# Patient Record
Sex: Male | Born: 1965 | Race: Black or African American | Hispanic: No | Marital: Single | Smoking: Former smoker
Health system: Southern US, Community
[De-identification: ages and names within clinical notes are randomized; demographics above are authoritative.]

## PROBLEM LIST (undated history)

## (undated) DIAGNOSIS — R001 Bradycardia, unspecified: Secondary | ICD-10-CM

## (undated) DIAGNOSIS — N289 Disorder of kidney and ureter, unspecified: Secondary | ICD-10-CM

## (undated) DIAGNOSIS — F32A Depression, unspecified: Secondary | ICD-10-CM

## (undated) DIAGNOSIS — R45851 Suicidal ideations: Secondary | ICD-10-CM

## (undated) DIAGNOSIS — F329 Major depressive disorder, single episode, unspecified: Secondary | ICD-10-CM

## (undated) HISTORY — DX: Depression, unspecified: F32.A

## (undated) HISTORY — PX: LITHOTRIPSY: SUR834

## (undated) HISTORY — PX: KNEE SURGERY: SHX244

## (undated) HISTORY — PX: TONSILLECTOMY: SUR1361

---
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
## 1998-04-15 HISTORY — PX: LITHOTRIPSY: SUR834

## 2001-07-28 ENCOUNTER — Emergency Department (HOSPITAL_COMMUNITY): Admission: EM | Admit: 2001-07-28 | Discharge: 2001-07-28 | Payer: Self-pay | Admitting: Emergency Medicine

## 2001-07-29 ENCOUNTER — Encounter: Payer: Self-pay | Admitting: Emergency Medicine

## 2001-07-29 ENCOUNTER — Emergency Department (HOSPITAL_COMMUNITY): Admission: EM | Admit: 2001-07-29 | Discharge: 2001-07-29 | Payer: Self-pay | Admitting: Emergency Medicine

## 2002-01-24 ENCOUNTER — Emergency Department (HOSPITAL_COMMUNITY): Admission: EM | Admit: 2002-01-24 | Discharge: 2002-01-24 | Payer: Self-pay | Admitting: *Deleted

## 2002-01-24 ENCOUNTER — Encounter: Payer: Self-pay | Admitting: *Deleted

## 2002-02-18 ENCOUNTER — Emergency Department (HOSPITAL_COMMUNITY): Admission: EM | Admit: 2002-02-18 | Discharge: 2002-02-18 | Payer: Self-pay | Admitting: Emergency Medicine

## 2002-03-15 ENCOUNTER — Emergency Department (HOSPITAL_COMMUNITY): Admission: EM | Admit: 2002-03-15 | Discharge: 2002-03-15 | Payer: Self-pay | Admitting: Emergency Medicine

## 2002-11-17 ENCOUNTER — Emergency Department (HOSPITAL_COMMUNITY): Admission: EM | Admit: 2002-11-17 | Discharge: 2002-11-17 | Payer: Self-pay | Admitting: Emergency Medicine

## 2002-12-06 ENCOUNTER — Emergency Department (HOSPITAL_COMMUNITY): Admission: EM | Admit: 2002-12-06 | Discharge: 2002-12-06 | Payer: Self-pay | Admitting: Emergency Medicine

## 2005-02-20 ENCOUNTER — Emergency Department (HOSPITAL_COMMUNITY): Admission: EM | Admit: 2005-02-20 | Discharge: 2005-02-20 | Payer: Self-pay | Admitting: Emergency Medicine

## 2006-09-11 ENCOUNTER — Emergency Department (HOSPITAL_COMMUNITY): Admission: EM | Admit: 2006-09-11 | Discharge: 2006-09-11 | Payer: Self-pay | Admitting: Emergency Medicine

## 2006-09-16 ENCOUNTER — Ambulatory Visit: Payer: Self-pay | Admitting: Cardiology

## 2006-11-07 ENCOUNTER — Emergency Department (HOSPITAL_COMMUNITY): Admission: EM | Admit: 2006-11-07 | Discharge: 2006-11-07 | Payer: Self-pay | Admitting: Emergency Medicine

## 2007-06-03 ENCOUNTER — Emergency Department (HOSPITAL_COMMUNITY): Admission: EM | Admit: 2007-06-03 | Discharge: 2007-06-03 | Payer: Self-pay | Admitting: Emergency Medicine

## 2010-01-03 ENCOUNTER — Emergency Department (HOSPITAL_COMMUNITY): Admission: EM | Admit: 2010-01-03 | Discharge: 2010-01-03 | Payer: Self-pay | Admitting: Emergency Medicine

## 2010-12-13 ENCOUNTER — Encounter: Payer: Self-pay | Admitting: Emergency Medicine

## 2010-12-13 ENCOUNTER — Emergency Department (HOSPITAL_COMMUNITY)
Admission: EM | Admit: 2010-12-13 | Discharge: 2010-12-13 | Disposition: A | Discharge status: Home / Self Care | Payer: Medicaid Other | Attending: Emergency Medicine | Admitting: Emergency Medicine

## 2010-12-13 ENCOUNTER — Emergency Department (HOSPITAL_COMMUNITY): Payer: Medicaid Other

## 2010-12-13 DIAGNOSIS — R079 Chest pain, unspecified: Secondary | ICD-10-CM | POA: Insufficient documentation

## 2010-12-13 DIAGNOSIS — F17200 Nicotine dependence, unspecified, uncomplicated: Secondary | ICD-10-CM | POA: Insufficient documentation

## 2010-12-13 LAB — BASIC METABOLIC PANEL
Calcium: 9.3 mg/dL (ref 8.4–10.5)
Creatinine, Ser: 1.04 mg/dL (ref 0.50–1.35)
GFR calc Af Amer: >60 mL/min (ref >60)
GFR calc non Af Amer: >60 mL/min (ref >60)
Sodium: 139 mEq/L (ref 135–145)

## 2010-12-13 LAB — CBC
MCH: 28.6 pg (ref 26.0–34.0)
MCHC: 32.8 g/dL (ref 30.0–36.0)
MCV: 87.1 fL (ref 78.0–100.0)
Platelets: 321 10*3/uL (ref 150–400)
RBC: 4.34 MIL/uL (ref 4.22–5.81)
RDW: 14 % (ref 11.5–15.5)

## 2010-12-13 LAB — CARDIAC PANEL(CRET KIN+CKTOT+MB+TROPI)
CK, MB: 2.3 ng/mL (ref 0.3–4.0)
Total CK: 168 U/L (ref 7–232)

## 2010-12-13 MED ORDER — SODIUM CHLORIDE 0.9 % IV SOLN
INTRAVENOUS | Status: DC
  Administered 2010-12-13: 10:00:00 via INTRAVENOUS

## 2010-12-13 MED ORDER — NITROGLYCERIN 0.4 MG SL SUBL
0.4000 mg | SUBLINGUAL_TABLET | SUBLINGUAL | Status: DC | PRN
  Administered 2010-12-13 (×3): 0.4 mg via SUBLINGUAL

## 2010-12-13 MED ORDER — ASPIRIN 81 MG PO CHEW
324.0000 mg | CHEWABLE_TABLET | Freq: Once | ORAL | Status: AC
  Administered 2010-12-13: 324 mg via ORAL
  Filled 2010-12-13: qty 4

## 2010-12-13 MED ORDER — ASPIRIN 325 MG PO TABS
325.0000 mg | ORAL_TABLET | ORAL | Status: DC
  Filled 2010-12-13: qty 4

## 2010-12-13 NOTE — ED Notes (Signed)
Pt c/o cp/sob/left arm numbness/n since 0600 this am

## 2010-12-13 NOTE — ED Notes (Signed)
Med Student to bedside for evaluation of patient.

## 2010-12-13 NOTE — ED Notes (Addendum)
Pt denies need for pain and nausea medication at this time and is eager to be discharged from hospital. Pt states needs to get back to job as he is concerned about loss of employment. Dr Adriana Simas aware.

## 2010-12-13 NOTE — ED Provider Notes (Signed)
History   Chart scribed for Benjamin Hutching, MD by Enos Fling; the patient was seen in room APA18/APA18; this patient's care was started at 10:24 AM.    CSN: [id number] Arrival date & time: 12/13/2010  9:25 AM  Chief Complaint  Patient presents with  . Chest Pain   HPI Benjamin Navarro is a 45 y.o. male who presents to the Emergency Department complaining of chest pain. Pt reports waking with chest pain at 6am in the precordial area radiating to left shoulder, pain lasted approx 15 minutes and then improved. Pain is associated left hand numbness, nausea, and sob. Pain improved in ED with NTG. No diaphoresis or vomiting. Pt states similar episode of pain occurred several years ago but was not evaluated. +h/o bradycardia. Pt is smoker (2ppd)  for 20+ years. No h/o HTN, DM, or high cholesterol. Fh/o HTN but no fh/o MI or CABG. Pt reports surgery 1 month ago to remove kidney stones.  PCP Dr. Mena Pauls  History reviewed. No pertinent past medical history.  Past Surgical History  Procedure Date  . Lithotripsy     Family History  Problem Relation Age of Onset  . Hypertension Mother   . Hypertension Father   . Hypertension Sister   . Hypertension Brother     History  Substance Use Topics  . Smoking status: Current Everyday Smoker -- 2.0 packs/day  . Smokeless tobacco: Not on file  . Alcohol Use: No   Previous Medications   HYDROCODONE-ACETAMINOPHEN (VICODIN) 5-500 MG PER TABLET    Take 1 tablet by mouth every 6 (six) hours as needed. For pain    NAPROXEN SODIUM (ANAPROX) 220 MG TABLET    Take 220 mg by mouth 2 (two) times daily with a meal. For pain    OXYCODONE-ACETAMINOPHEN (PERCOCET) 5-325 MG PER TABLET    Take 1 tablet by mouth every 4 (four) hours as needed. For pain    PENICILLIN V POTASSIUM (VEETID) 500 MG TABLET    Take 500 mg by mouth 4 (four) times daily. Take for 7 days    PHENAZOPYRIDINE (PYRIDIUM) 200 MG TABLET    Take 200 mg by mouth 3 (three) times daily as needed. For bladder  infection      Allergies as of 12/13/2010  . (No Known Allergies)       Review of Systems 10 Systems reviewed and are negative for acute change except as noted in the HPI.  Physical Exam  BP 114/61  Pulse 50  Temp 98.4 F (36.9 C)  Resp 18  Ht 5\' 8"  (1.727 m)  Wt 160 lb (72.576 kg)  BMI 24.33 kg/m2  SpO2 100%  Physical Exam  Nursing note and vitals reviewed. Constitutional: He is oriented to person, place, and time. No distress.       Appearance consistent with age of record  HENT:  Head: Normocephalic and atraumatic.  Right Ear: External ear normal.  Left Ear: External ear normal.  Nose: Nose normal.  Mouth/Throat: Oropharynx is clear and moist.  Eyes: Conjunctivae are normal.  Neck: Neck supple.  Cardiovascular: Regular rhythm.  Exam reveals no gallop and no friction rub.   No murmur heard.      bradycardia  Pulmonary/Chest: Effort normal and breath sounds normal. He has no wheezes. He has no rhonchi. He has no rales. He exhibits no tenderness.  Abdominal: Soft. There is no tenderness.  Musculoskeletal: Normal range of motion.       Normal appearance of extremities  Neurological: He is  alert and oriented to person, place, and time. No sensory deficit.  Skin: No rash noted.       Color normal  Psychiatric: He has a normal mood and affect.    ED Course  Procedures OTHER DATA REVIEWED: Nursing notes and vital signs reviewed. Prior records reviewed.   DIAGNOSTIC STUDIES: Oxygen Saturation is 97% on room air, normal by my Interpretation.  EKG:   Date: 12/13/2010  Rate:52  Rhythm: sinus bradycardia  QRS Axis: normal  Intervals: normal  ST/T Wave abnormalities: normal  Conduction Disutrbances:none  Narrative Interpretation:   Old EKG Reviewed: none available  c SA Cardiac Monitor: 12/13/2010 10:59 AM Sinus bradycardia, rate 40, no ectopy   LABS / RADIOLOGY: Results for orders placed during the hospital encounter of 12/13/10  CBC      Component  Value Range   WBC 10.0  4.0 - 10.5 (K/uL)   RBC 4.34  4.22 - 5.81 (MIL/uL)   Hemoglobin 12.4 (*) 13.0 - 17.0 (g/dL)   HCT 96.2 (*) 95.2 - 52.0 (%)   MCV 87.1  78.0 - 100.0 (fL)   MCH 28.6  26.0 - 34.0 (pg)   MCHC 32.8  30.0 - 36.0 (g/dL)   RDW 84.1  32.4 - 40.1 (%)   Platelets 321  150 - 400 (K/uL)  BASIC METABOLIC PANEL      Component Value Range   Sodium 139  135 - 145 (mEq/L)   Potassium 3.7  3.5 - 5.1 (mEq/L)   Chloride 105  96 - 112 (mEq/L)   CO2 27  19 - 32 (mEq/L)   Glucose, Bld 111 (*) 70 - 99 (mg/dL)   BUN 11  6 - 23 (mg/dL)   Creatinine, Ser 0.27  0.50 - 1.35 (mg/dL)   Calcium 9.3  8.4 - 25.3 (mg/dL)   GFR calc non Af Amer >60  >60 (mL/min)   GFR calc Af Amer >60  >60 (mL/min)  CARDIAC PANEL(CRET KIN+CKTOT+MB+TROPI)      Component Value Range   Total CK 168  7 - 232 (U/L)   CK, MB 2.3  0.3 - 4.0 (ng/mL)   Troponin I <0.30  <0.30 (ng/mL)   Relative Index 1.4  0.0 - 2.5    Chest Portable 1 View  12/13/2010  *RADIOLOGY REPORT*  Clinical Data: Chest pain.  Bradycardia.  PORTABLE CHEST - 1 VIEW  Comparison: 06/03/2007  Findings: Pleural - parenchymal scarring at the right lung base is stable.  Lungs otherwise clear.  No evidence of pleural effusion. Heart size within normal limits.  No mass or lymphadenopathy identified.  IMPRESSION: Stable right basilar scarring.  No acute findings.  Original Report Authenticated By: Danae Orleans, M.D.     ED COURSE: I recommend patient be admitted to the hospital. He understands risks of leaving. He wants to go home.  MDM: Pulse and low 40s noted. Otherwise negative workup. Patient claims history of bradycardia.  IMPRESSION: No diagnosis found.   PLAN: All results reviewed and discussed with pt, questions answered, pt agreeable with plan.   CONDITION ON DISCHARGE stable   MEDS GIVEN IN ED:  Medications  nitroGLYCERIN (NITROSTAT) SL tablet 0.4 mg (0.4 mg Sublingual Given 12/13/10 1020)  0.9 %  sodium chloride infusion (   Intravenous New Bag 12/13/10 0945)  penicillin v potassium (VEETID) 500 MG tablet (not administered)  naproxen sodium (ANAPROX) 220 MG tablet (not administered)  HYDROcodone-acetaminophen (VICODIN) 5-500 MG per tablet (not administered)  oxyCODONE-acetaminophen (PERCOCET) 5-325 MG per tablet (not  administered)  phenazopyridine (PYRIDIUM) 200 MG tablet (not administered)  aspirin chewable tablet 324 mg (324 mg Oral Given 12/13/10 0941)     DISCHARGE MEDICATIONS: New Prescriptions   No medications on file     SCRIBE ATTESTATION:   I personally performed the services described in this documentation, which was scribed in my presence. The recorded information has been reviewed and considered. Benjamin Hutching, MD    Benjamin Hutching, MD 12/13/10 [phone]

## 2011-01-28 LAB — POCT CARDIAC MARKERS
CKMB, poc: <1 — ABNORMAL LOW
Myoglobin, poc: 34.2
Operator id: 217071
Troponin i, poc: <0.05

## 2011-01-28 LAB — TROPONIN I: Troponin I: 0.02

## 2011-01-28 LAB — DIFFERENTIAL
Basophils Relative: 1
Eosinophils Absolute: 0.4
Lymphs Abs: 1.9
Neutro Abs: 3.6
Neutrophils Relative %: 54

## 2011-01-28 LAB — CBC
MCV: 89.4
Platelets: 217
WBC: 6.7

## 2011-01-28 LAB — BASIC METABOLIC PANEL
BUN: 11
Calcium: 8.8
Chloride: 106
Creatinine, Ser: 0.91
GFR calc Af Amer: >60

## 2011-01-28 LAB — CK TOTAL AND CKMB (NOT AT ARMC): Total CK: 141

## 2017-11-29 ENCOUNTER — Emergency Department (HOSPITAL_COMMUNITY)

## 2017-11-29 ENCOUNTER — Encounter (HOSPITAL_COMMUNITY): Payer: Self-pay | Admitting: Emergency Medicine

## 2017-11-29 ENCOUNTER — Emergency Department (HOSPITAL_COMMUNITY)
Admission: EM | Admit: 2017-11-29 | Discharge: 2017-11-29 | Disposition: A | Discharge status: Home / Self Care | Attending: Emergency Medicine | Admitting: Emergency Medicine

## 2017-11-29 DIAGNOSIS — Z79899 Other long term (current) drug therapy: Secondary | ICD-10-CM | POA: Diagnosis not present

## 2017-11-29 DIAGNOSIS — R404 Transient alteration of awareness: Secondary | ICD-10-CM | POA: Insufficient documentation

## 2017-11-29 DIAGNOSIS — F17210 Nicotine dependence, cigarettes, uncomplicated: Secondary | ICD-10-CM | POA: Insufficient documentation

## 2017-11-29 DIAGNOSIS — R4182 Altered mental status, unspecified: Secondary | ICD-10-CM | POA: Diagnosis present

## 2017-11-29 HISTORY — DX: Disorder of kidney and ureter, unspecified: N28.9

## 2017-11-29 LAB — URINALYSIS, ROUTINE W REFLEX MICROSCOPIC
BILIRUBIN URINE: NEGATIVE
Bacteria, UA: NONE SEEN
GLUCOSE, UA: NEGATIVE mg/dL
HGB URINE DIPSTICK: NEGATIVE
KETONES UR: NEGATIVE mg/dL
NITRITE: NEGATIVE
PH: 7 (ref 5.0–8.0)
PROTEIN: NEGATIVE mg/dL
Specific Gravity, Urine: 1.019 (ref 1.005–1.030)

## 2017-11-29 LAB — COMPREHENSIVE METABOLIC PANEL
ALBUMIN: 3.4 g/dL — AB (ref 3.5–5.0)
ALK PHOS: 55 U/L (ref 38–126)
ALT: 13 U/L (ref 0–44)
ANION GAP: 7 (ref 5–15)
AST: 16 U/L (ref 15–41)
BILIRUBIN TOTAL: 0.7 mg/dL (ref 0.3–1.2)
BUN: 12 mg/dL (ref 6–20)
CALCIUM: 8.5 mg/dL — AB (ref 8.9–10.3)
CO2: 27 mmol/L (ref 22–32)
Chloride: 106 mmol/L (ref 98–111)
Creatinine, Ser: 1.14 mg/dL (ref 0.61–1.24)
GFR calc Af Amer: >60 mL/min (ref >60)
GFR calc non Af Amer: >60 mL/min (ref >60)
GLUCOSE: 96 mg/dL (ref 70–99)
Potassium: 4.2 mmol/L (ref 3.5–5.1)
SODIUM: 140 mmol/L (ref 135–145)
TOTAL PROTEIN: 6.6 g/dL (ref 6.5–8.1)

## 2017-11-29 LAB — CBC WITH DIFFERENTIAL/PLATELET
BASOS ABS: 0.1 10*3/uL (ref 0.0–0.1)
BASOS PCT: 1 %
EOS PCT: 3 %
Eosinophils Absolute: 0.2 10*3/uL (ref 0.0–0.7)
HEMATOCRIT: 43.2 % (ref 39.0–52.0)
Hemoglobin: 13.4 g/dL (ref 13.0–17.0)
Lymphocytes Relative: 26 %
Lymphs Abs: 1.9 10*3/uL (ref 0.7–4.0)
MCH: 29 pg (ref 26.0–34.0)
MCHC: 31 g/dL (ref 30.0–36.0)
MCV: 93.5 fL (ref 78.0–100.0)
MONO ABS: 0.6 10*3/uL (ref 0.1–1.0)
MONOS PCT: 9 %
NEUTROS ABS: 4.4 10*3/uL (ref 1.7–7.7)
Neutrophils Relative %: 61 %
PLATELETS: 268 10*3/uL (ref 150–400)
RBC: 4.62 MIL/uL (ref 4.22–5.81)
RDW: 14 % (ref 11.5–15.5)
WBC: 7.2 10*3/uL (ref 4.0–10.5)

## 2017-11-29 LAB — AMMONIA: Ammonia: 13 umol/L (ref 9–35)

## 2017-11-29 LAB — RAPID URINE DRUG SCREEN, HOSP PERFORMED
Amphetamines: NOT DETECTED
BARBITURATES: NOT DETECTED
Benzodiazepines: NOT DETECTED
Cocaine: NOT DETECTED
Opiates: NOT DETECTED
TETRAHYDROCANNABINOL: POSITIVE — AB

## 2017-11-29 LAB — ACETAMINOPHEN LEVEL: Acetaminophen (Tylenol), Serum: <10 ug/mL — ABNORMAL LOW (ref 10–30)

## 2017-11-29 LAB — SALICYLATE LEVEL: Salicylate Lvl: <7 mg/dL (ref 2.8–30.0)

## 2017-11-29 LAB — CBG MONITORING, ED: Glucose-Capillary: 73 mg/dL (ref 70–99)

## 2017-11-29 MED ORDER — SODIUM CHLORIDE 0.9 % IV BOLUS
1000.0000 mL | Freq: Once | INTRAVENOUS | Status: AC
  Administered 2017-11-29: 1000 mL via INTRAVENOUS

## 2017-11-29 MED ORDER — SODIUM CHLORIDE 0.9 % IV SOLN: INTRAVENOUS | Status: DC

## 2017-11-29 NOTE — ED Notes (Signed)
PT ambulated in room at this time. EDP made aware.

## 2017-11-29 NOTE — ED Notes (Signed)
When pt was told we needed to do an in and out catheter for urine he raised his right hand up and was offered a urinal. PT was able to urinate into urinal at this time.

## 2017-11-29 NOTE — ED Provider Notes (Signed)
[facility] EMERGENCY DEPARTMENT Provider Note   CSN: [id number] Arrival date & time: 11/29/17  1117     History   Chief Complaint Chief Complaint  Patient presents with  . Altered Mental Status    HPI Valdemar L XxxArtis is a 52 y.o. male.  Pt presents to the ED today with altered mental status.  [facility]  He was put on suicide watch yesterday because he told the jail that he did not know what he was going to do to himself.  He has also been making verbal threats to the staff at the jail.  They have had him on q15 min checks.  When they went to check on him pta, they found him on the ground unresponsive.  The pt would not talk to anyone, including me.  The jail denies finding any drugs in his room or anything that he could hurt himself with.     Past Medical History:  Diagnosis Date  . Renal disorder    kidney stones    There are no active problems to display for this patient.   Past Surgical History:  Procedure Laterality Date  . LITHOTRIPSY          Home Medications    Prior to Admission medications   Medication Sig Start Date End Date Taking? Authorizing Provider  OLANZapine (ZYPREXA) 5 MG tablet Take 10 mg by mouth daily.    [provider]    Family History Family History  Problem Relation Age of Onset  . Hypertension Mother   . Hypertension Father   . Hypertension Sister   . Hypertension Brother     Social History Social History   Tobacco Use  . Smoking status: Current Every Day Smoker    Packs/day: 2.00  . Smokeless tobacco: Never Used  Substance Use Topics  . Alcohol use: No  . Drug use: No     Allergies   Patient has no known allergies.   Review of Systems Review of Systems  Unable to perform ROS: Mental status change     Physical Exam Updated Vital Signs BP 109/62   Pulse (!) 45   Resp 18   SpO2 98%   Physical Exam  Constitutional: He appears well-developed and well-nourished.  HENT:  Head:  Normocephalic and atraumatic.  Right Ear: External ear normal.  Left Ear: External ear normal.  Nose: Nose normal.  Mouth/Throat: Oropharynx is clear and moist.  Eyes: Pupils are equal, round, and reactive to light. Conjunctivae and EOM are normal.  Neck: Normal range of motion. Neck supple.  Cardiovascular: Normal rate, regular rhythm, normal heart sounds and intact distal pulses.  Pulmonary/Chest: Effort normal and breath sounds normal.  Abdominal: Soft. Bowel sounds are normal.  Musculoskeletal: Normal range of motion.  Neurological:  Pt is awake.  He is not following any commands.  He will respond to painful stimuli.  He will not drop his arms over his face.    Skin: Skin is warm. Capillary refill takes less than 2 seconds.  Psychiatric:  Unable to assess  Nursing note and vitals reviewed.    ED Treatments / Results  Labs (all labs ordered are listed, but only abnormal results are displayed) Labs Reviewed  COMPREHENSIVE METABOLIC PANEL - Abnormal; Notable for the following components:      Result Value   Calcium 8.5 (*)    Albumin 3.4 (*)    All other components within normal limits  RAPID URINE DRUG SCREEN,  HOSP PERFORMED - Abnormal; Notable for the following components:   Tetrahydrocannabinol POSITIVE (*)    All other components within normal limits  URINALYSIS, ROUTINE W REFLEX MICROSCOPIC - Abnormal; Notable for the following components:   Leukocytes, UA SMALL (*)    All other components within normal limits  ACETAMINOPHEN LEVEL - Abnormal; Notable for the following components:   Acetaminophen (Tylenol), Serum <10 (*)    All other components within normal limits  CBC WITH DIFFERENTIAL/PLATELET  AMMONIA  SALICYLATE LEVEL  CBG MONITORING, ED    EKG EKG Interpretation  Date/Time:  Saturday November 29 2017 11:20:47 EDT Ventricular Rate:  50 PR Interval:    QRS Duration: 90 QT Interval:  413 QTC Calculation: 377 R Axis:   68 Text Interpretation:  Sinus rhythm  Abnormal R-wave progression, early transition Minimal ST elevation, anterior leads No significant change since last tracing Confirmed by Jacalyn Lefevre [phone]) on 11/29/2017 1:06:27 PM   Radiology Ct Head Wo Contrast  Result Date: 11/29/2017 CLINICAL DATA:  Found unconscious, nonverbal, altered mental status EXAM: CT HEAD WITHOUT CONTRAST CT CERVICAL SPINE WITHOUT CONTRAST TECHNIQUE: Multidetector CT imaging of the head and cervical spine was performed following the standard protocol without intravenous contrast. Multiplanar CT image reconstructions of the cervical spine were also generated. COMPARISON:  07/30/2016 FINDINGS: CT HEAD FINDINGS Brain: No evidence of acute infarction, hemorrhage, hydrocephalus, extra-axial collection or mass lesion/mass effect. Vascular: No hyperdense vessel or unexpected calcification. Skull: Normal. Negative for fracture or focal lesion. Sinuses/Orbits: Minor scattered sinus mucosal thickening diffusely. No sinus air-fluid level or complete opacification. Orbits unremarkable. Other: External auditory canals demonstrate cerumen impaction bilaterally. CT CERVICAL SPINE FINDINGS Alignment: Normal. Skull base and vertebrae: Incomplete fusion of the C1 arch posteriorly, normal variant. No acute osseous finding or fracture. Vertebral body heights maintained. No subluxation or dislocation. Soft tissues and spinal canal: No prevertebral fluid or swelling. No visible canal hematoma. Disc levels: Minor endplate bony spurring at C5-6. Otherwise disc spaces preserved. Upper chest: Apical bullous disease noted, worse on the right. Other: None. IMPRESSION: Normal head CT without contrast. Mild chronic sinus mucosal thickening Cerumen impaction bilaterally Minor C5-6 spondylosis without acute cervical spine fracture or malalignment. Stable exam. Electronically Signed   By: Judie Petit.  Shick M.D.   On: 11/29/2017 15:12   Ct Cervical Spine Wo Contrast  Result Date: 11/29/2017 CLINICAL DATA:  Found  unconscious, nonverbal, altered mental status EXAM: CT HEAD WITHOUT CONTRAST CT CERVICAL SPINE WITHOUT CONTRAST TECHNIQUE: Multidetector CT imaging of the head and cervical spine was performed following the standard protocol without intravenous contrast. Multiplanar CT image reconstructions of the cervical spine were also generated. COMPARISON:  07/30/2016 FINDINGS: CT HEAD FINDINGS Brain: No evidence of acute infarction, hemorrhage, hydrocephalus, extra-axial collection or mass lesion/mass effect. Vascular: No hyperdense vessel or unexpected calcification. Skull: Normal. Negative for fracture or focal lesion. Sinuses/Orbits: Minor scattered sinus mucosal thickening diffusely. No sinus air-fluid level or complete opacification. Orbits unremarkable. Other: External auditory canals demonstrate cerumen impaction bilaterally. CT CERVICAL SPINE FINDINGS Alignment: Normal. Skull base and vertebrae: Incomplete fusion of the C1 arch posteriorly, normal variant. No acute osseous finding or fracture. Vertebral body heights maintained. No subluxation or dislocation. Soft tissues and spinal canal: No prevertebral fluid or swelling. No visible canal hematoma. Disc levels: Minor endplate bony spurring at C5-6. Otherwise disc spaces preserved. Upper chest: Apical bullous disease noted, worse on the right. Other: None. IMPRESSION: Normal head CT without contrast. Mild chronic sinus mucosal thickening Cerumen impaction bilaterally Minor C5-6 spondylosis  without acute cervical spine fracture or malalignment. Stable exam. Electronically Signed   By: Judie PetitM.  Shick M.D.   On: 11/29/2017 15:12   Dg Chest Port 1 View  Result Date: 11/29/2017 CLINICAL DATA:  Altered consciousness.  Suicidal. EXAM: PORTABLE CHEST 1 VIEW COMPARISON:  CT 02/14/2017 FINDINGS: Patient rotated to the left on both radiographs. Normal heart size. No pleural effusion or pneumothorax. Vague increased density throughout the right hemithorax is favored to be  artifactual. No well-defined lobar consolidation. The previously described left-sided pulmonary opacities on CT are not readily apparent. Atherosclerosis in the transverse aorta. IMPRESSION: No definite acute disease. Apparent increased density throughout the right lung is favored to be artifactual. Right base airspace disease cannot be entirely excluded. Aortic Atherosclerosis (ICD10-I70.0). Left-sided pulmonary lesions described on 02/14/2017 CT are not apparent. Please see that report. Electronically Signed   By: Jeronimo GreavesKyle  Talbot M.D.   On: 11/29/2017 13:12    Procedures Procedures (including critical care time)  Medications Ordered in ED Medications  sodium chloride 0.9 % bolus 1,000 mL (1,000 mLs Intravenous New Bag/Given 11/29/17 1223)    And  0.9 %  sodium chloride infusion (has no administration in time range)     Initial Impression / Assessment and Plan / ED Course  I have reviewed the triage vital signs and the nursing notes.  Pertinent labs & imaging results that were available during my care of the patient were reviewed by me and considered in my medical decision making (see chart for details).    When the nurse went to collect urine, she told him she'd have to cath him.  However, he held up a hand for a urinal and was able to urinate into the urinal.  Pt now awake and alert and talking.  He is able to ambulate.  There is medical reason found for his confusion, suspect conversion d/o.    Pt is now stable to go back to jail under suicide watch.  Continue every 15 minute evals while there.  Final Clinical Impressions(s) / ED Diagnoses   Final diagnoses:  Transient alteration of awareness    ED Discharge Orders    None       Jacalyn LefevreHaviland, Harrie Cazarez, MD 11/29/17 1700

## 2017-11-29 NOTE — ED Triage Notes (Signed)
[facility]o unconscious. Staff reports pt is on 15 min checks due to verbal threats to staff for supposedly not administering his home medications correctly. PT arrives to ED non-verbal with eyes closed. PT withdraws from painful stimuli and response from amnia inhalent upon arrival.

## 2018-05-05 ENCOUNTER — Emergency Department (HOSPITAL_COMMUNITY): Payer: Self-pay

## 2018-05-05 ENCOUNTER — Other Ambulatory Visit: Payer: Self-pay

## 2018-05-05 ENCOUNTER — Encounter (HOSPITAL_COMMUNITY): Payer: Self-pay | Admitting: Emergency Medicine

## 2018-05-05 ENCOUNTER — Inpatient Hospital Stay (HOSPITAL_COMMUNITY)
Admission: EM | Admit: 2018-05-05 | Discharge: 2018-05-06 | DRG: 308 | Disposition: A | Discharge status: Home / Self Care | Payer: Self-pay | Attending: Family Medicine | Admitting: Family Medicine

## 2018-05-05 DIAGNOSIS — E559 Vitamin D deficiency, unspecified: Secondary | ICD-10-CM | POA: Diagnosis present

## 2018-05-05 DIAGNOSIS — F329 Major depressive disorder, single episode, unspecified: Secondary | ICD-10-CM | POA: Diagnosis present

## 2018-05-05 DIAGNOSIS — R001 Bradycardia, unspecified: Principal | ICD-10-CM | POA: Diagnosis present

## 2018-05-05 DIAGNOSIS — R55 Syncope and collapse: Secondary | ICD-10-CM | POA: Diagnosis present

## 2018-05-05 DIAGNOSIS — F459 Somatoform disorder, unspecified: Secondary | ICD-10-CM | POA: Diagnosis present

## 2018-05-05 DIAGNOSIS — F17210 Nicotine dependence, cigarettes, uncomplicated: Secondary | ICD-10-CM | POA: Diagnosis present

## 2018-05-05 DIAGNOSIS — Z765 Malingerer [conscious simulation]: Secondary | ICD-10-CM

## 2018-05-05 DIAGNOSIS — Z8249 Family history of ischemic heart disease and other diseases of the circulatory system: Secondary | ICD-10-CM

## 2018-05-05 DIAGNOSIS — Z880 Allergy status to penicillin: Secondary | ICD-10-CM

## 2018-05-05 DIAGNOSIS — F1290 Cannabis use, unspecified, uncomplicated: Secondary | ICD-10-CM | POA: Diagnosis present

## 2018-05-05 DIAGNOSIS — I469 Cardiac arrest, cause unspecified: Secondary | ICD-10-CM | POA: Diagnosis present

## 2018-05-05 DIAGNOSIS — F419 Anxiety disorder, unspecified: Secondary | ICD-10-CM | POA: Diagnosis present

## 2018-05-05 DIAGNOSIS — R45851 Suicidal ideations: Secondary | ICD-10-CM | POA: Diagnosis present

## 2018-05-05 DIAGNOSIS — Z91041 Radiographic dye allergy status: Secondary | ICD-10-CM

## 2018-05-05 DIAGNOSIS — R404 Transient alteration of awareness: Secondary | ICD-10-CM

## 2018-05-05 DIAGNOSIS — R4589 Other symptoms and signs involving emotional state: Secondary | ICD-10-CM | POA: Diagnosis present

## 2018-05-05 LAB — RAPID URINE DRUG SCREEN, HOSP PERFORMED
Amphetamines: NOT DETECTED
BARBITURATES: NOT DETECTED
BENZODIAZEPINES: NOT DETECTED
Cocaine: NOT DETECTED
Opiates: NOT DETECTED
Tetrahydrocannabinol: POSITIVE — AB

## 2018-05-05 LAB — URINALYSIS, ROUTINE W REFLEX MICROSCOPIC
BILIRUBIN URINE: NEGATIVE
GLUCOSE, UA: NEGATIVE mg/dL
HGB URINE DIPSTICK: NEGATIVE
Ketones, ur: NEGATIVE mg/dL
Leukocytes, UA: NEGATIVE
Nitrite: NEGATIVE
PH: 8 (ref 5.0–8.0)
Protein, ur: NEGATIVE mg/dL
SPECIFIC GRAVITY, URINE: 1.008 (ref 1.005–1.030)

## 2018-05-05 LAB — COMPREHENSIVE METABOLIC PANEL
ALBUMIN: 3.8 g/dL (ref 3.5–5.0)
ALT: 12 U/L (ref 0–44)
ANION GAP: 10 (ref 5–15)
AST: 18 U/L (ref 15–41)
Alkaline Phosphatase: 68 U/L (ref 38–126)
BUN: 10 mg/dL (ref 6–20)
CALCIUM: 8.8 mg/dL — AB (ref 8.9–10.3)
CHLORIDE: 106 mmol/L (ref 98–111)
CO2: 24 mmol/L (ref 22–32)
Creatinine, Ser: 0.96 mg/dL (ref 0.61–1.24)
GFR calc non Af Amer: >60 mL/min (ref >60)
GLUCOSE: 90 mg/dL (ref 70–99)
Potassium: 4.3 mmol/L (ref 3.5–5.1)
SODIUM: 140 mmol/L (ref 135–145)
TOTAL PROTEIN: 7.4 g/dL (ref 6.5–8.1)
Total Bilirubin: 0.5 mg/dL (ref 0.3–1.2)

## 2018-05-05 LAB — CBC WITH DIFFERENTIAL/PLATELET
Abs Immature Granulocytes: 0.01 10*3/uL (ref 0.00–0.07)
Basophils Absolute: 0.1 10*3/uL (ref 0.0–0.1)
Basophils Relative: 1 %
EOS ABS: 0.3 10*3/uL (ref 0.0–0.5)
Eosinophils Relative: 5 %
HEMATOCRIT: 50.2 % (ref 39.0–52.0)
Hemoglobin: 15.4 g/dL (ref 13.0–17.0)
Immature Granulocytes: 0 %
LYMPHS ABS: 1.6 10*3/uL (ref 0.7–4.0)
Lymphocytes Relative: 33 %
MCH: 28.7 pg (ref 26.0–34.0)
MCHC: 30.7 g/dL (ref 30.0–36.0)
MCV: 93.7 fL (ref 80.0–100.0)
Monocytes Absolute: 0.4 10*3/uL (ref 0.1–1.0)
Monocytes Relative: 8 %
Neutro Abs: 2.6 10*3/uL (ref 1.7–7.7)
Neutrophils Relative %: 53 %
Platelets: 257 10*3/uL (ref 150–400)
RBC: 5.36 MIL/uL (ref 4.22–5.81)
RDW: 15.3 % (ref 11.5–15.5)
WBC: 4.9 10*3/uL (ref 4.0–10.5)
nRBC: 0 % (ref 0.0–0.2)

## 2018-05-05 LAB — TSH: TSH: 1.017 u[IU]/mL (ref 0.350–4.500)

## 2018-05-05 LAB — LIPASE, BLOOD: Lipase: 21 U/L (ref 11–51)

## 2018-05-05 LAB — VITAMIN B12: Vitamin B-12: 383 pg/mL (ref 180–914)

## 2018-05-05 LAB — CBG MONITORING, ED: Glucose-Capillary: 81 mg/dL (ref 70–99)

## 2018-05-05 LAB — TROPONIN I: Troponin I: <0.03 ng/mL (ref <0.03)

## 2018-05-05 LAB — LACTIC ACID, PLASMA: LACTIC ACID, VENOUS: 1.7 mmol/L (ref 0.5–1.9)

## 2018-05-05 MED ORDER — TRAZODONE HCL 50 MG PO TABS: 25.0000 mg | ORAL_TABLET | Freq: Every evening | ORAL | Status: DC | PRN

## 2018-05-05 MED ORDER — ENOXAPARIN SODIUM 40 MG/0.4ML ~~LOC~~ SOLN: 40.0000 mg | SUBCUTANEOUS | Status: DC

## 2018-05-05 MED ORDER — FLUMAZENIL 0.5 MG/5ML IV SOLN
0.2000 mg | Freq: Once | INTRAVENOUS | Status: AC
  Administered 2018-05-05: 0.2 mg via INTRAVENOUS
  Filled 2018-05-05: qty 5

## 2018-05-05 MED ORDER — POLYETHYLENE GLYCOL 3350 17 G PO PACK: 17.0000 g | PACK | Freq: Every day | ORAL | Status: DC | PRN

## 2018-05-05 MED ORDER — NALOXONE HCL 0.4 MG/ML IJ SOLN
0.4000 mg | Freq: Once | INTRAMUSCULAR | Status: AC
  Administered 2018-05-05: 0.4 mg via INTRAVENOUS
  Filled 2018-05-05: qty 1

## 2018-05-05 MED ORDER — ONDANSETRON HCL 4 MG PO TABS: 4.0000 mg | ORAL_TABLET | Freq: Four times a day (QID) | ORAL | Status: DC | PRN

## 2018-05-05 MED ORDER — CITALOPRAM HYDROBROMIDE 20 MG PO TABS
20.0000 mg | ORAL_TABLET | Freq: Every day | ORAL | Status: DC
  Administered 2018-05-05 – 2018-05-06 (×2): 20 mg via ORAL
  Filled 2018-05-05 (×2): qty 1

## 2018-05-05 MED ORDER — NICOTINE 21 MG/24HR TD PT24
21.0000 mg | MEDICATED_PATCH | Freq: Every day | TRANSDERMAL | Status: DC
  Filled 2018-05-05: qty 1

## 2018-05-05 MED ORDER — ACETAMINOPHEN 650 MG RE SUPP: 650.0000 mg | Freq: Four times a day (QID) | RECTAL | Status: DC | PRN

## 2018-05-05 MED ORDER — SODIUM CHLORIDE 0.9 % IV SOLN
INTRAVENOUS | Status: DC
  Administered 2018-05-05 – 2018-05-06 (×2): via INTRAVENOUS

## 2018-05-05 MED ORDER — ACETAMINOPHEN 325 MG PO TABS: 650.0000 mg | ORAL_TABLET | Freq: Four times a day (QID) | ORAL | Status: DC | PRN

## 2018-05-05 MED ORDER — ONDANSETRON HCL 4 MG/2ML IJ SOLN: 4.0000 mg | Freq: Four times a day (QID) | INTRAMUSCULAR | Status: DC | PRN

## 2018-05-05 NOTE — H&P (Signed)
History and Physical  Benjamin Navarro ZOX:[id number] DOB: 03-24-1966 DOA: 05/05/2018  Referring physician: Lincoln MaxinIdol PCP: Patient, No Pcp Per   Chief Complaint: LOC  HPI: Benjamin HawJohnny L Sukup is a 53 y.o. male with history of nephrolithiasis and depression was in route to an appointment with a local court house when he had a syncopal event when getting out of his car and going to the courtroom.   His family says that he has had bradycardia for all of his life.  He had been lethargic since this event and appears very drowsy, however does respond to direct questions.  He had taken a klonipin earlier.  He apparently did NOT try to overdose.  His pills were counted and the numbers appeared appropriate.  He was given atropine by EMS prior to arrival secondary to a pulse rate in the mid 40s.  He was also given 2.5 mg of Narcan and had improvement in his level of alertness briefly.  He told family members 2 days ago that he was going to jump off of a bridge and [facility] where he had been prescribed celexa and klonipin.  He had just taken the first klonipin tablet this morning prior to the syncopal event.  The RN found marijuana in his pill bottle.    ED Course:  Pt was seen in ED and noted to be bradycardic with HR 30-40 range.  His mentation has improved since being monitored in ED.  He was very unclear about his suicidal ideation status and family presented to assist with history and informed up that patient had threatened to jump off of a bridge just 2 days ago prompting [facility].  The patient's EKG confirmed sinus bradycardia.  He is being admitted for further observation and management.    Review of Systems: All systems reviewed and apart from history of presenting illness, are negative.  Past Medical History:  Diagnosis Date  . Renal disorder    kidney stones   Past Surgical History:  Procedure Laterality Date  . LITHOTRIPSY     Social History:  reports  that he has been smoking. He has been smoking about 2.00 packs per day. He has never used smokeless tobacco. He reports that he does not drink alcohol or use drugs.  No Known Allergies  Family History  Problem Relation Age of Onset  . Hypertension Mother   . Hypertension Father   . Hypertension Sister   . Hypertension Brother     Prior to Admission medications   Medication Sig Start Date End Date Taking? Authorizing Provider  citalopram (CELEXA) 20 MG tablet Take 20 mg by mouth daily. 05/04/18   [provider]  clonazePAM (KLONOPIN) 0.5 MG tablet Take 0.5 mg by mouth 2 (two) times daily as needed. for anxiety 05/04/18   [provider]  OLANZapine (ZYPREXA) 5 MG tablet Take 10 mg by mouth daily.    [provider]   Physical Exam: Vitals:   05/05/18 1200 05/05/18 1215 05/05/18 1230 05/05/18 1245  BP: 114/65 106/67 101/63 111/65  Pulse: (!) 51 (!) 40 (!) 42 (!) 39  Resp: 20 (!) 21 20 18   Temp:      TempSrc:      SpO2: 99% 99% 92% 95%    General exam: Moderately built and nourished patient, lying comfortably supine on the gurney in no obvious distress.  Head, eyes and ENT: Nontraumatic and normocephalic. Pupils equally reacting to  light and accommodation. Oral mucosa moist.  Neck: Supple. No JVD, carotid bruit or thyromegaly.  Lymphatics: No lymphadenopathy.  Respiratory system: Clear to auscultation. No increased work of breathing.  Cardiovascular system: S1 and S2 heard, bradycardic. No JVD, murmurs, gallops, clicks or pedal edema.  Gastrointestinal system: Abdomen is nondistended, soft and nontender. Normal bowel sounds heard. No organomegaly or masses appreciated.  Central nervous system: Alert and oriented. No focal neurological deficits.  Extremities: Symmetric 5 x 5 power. Peripheral pulses symmetrically felt.   Skin: No rashes or acute findings.  Musculoskeletal system: Negative exam.  Psychiatry: Pleasant and cooperative.  Labs on  Admission:  Basic Metabolic Panel: Recent Labs  Lab 05/05/18 1008  NA 140  K 4.3  CL 106  CO2 24  GLUCOSE 90  BUN 10  CREATININE 0.96  CALCIUM 8.8*   Liver Function Tests: Recent Labs  Lab 05/05/18 1008  AST 18  ALT 12  ALKPHOS 68  BILITOT 0.5  PROT 7.4  ALBUMIN 3.8   Recent Labs  Lab 05/05/18 1008  LIPASE 21   No results for input(s): AMMONIA in the last 168 hours. CBC: Recent Labs  Lab 05/05/18 1024  WBC 4.9  NEUTROABS 2.6  HGB 15.4  HCT 50.2  MCV 93.7  PLT 257   Cardiac Enzymes: Recent Labs  Lab 05/05/18 1008  TROPONINI <0.03    BNP (last 3 results) No results for input(s): PROBNP in the last 8760 hours. CBG: Recent Labs  Lab 05/05/18 1124  GLUCAP 81    Radiological Exams on Admission: Ct Head Wo Contrast  Result Date: 05/05/2018 CLINICAL DATA:  Recent syncopal episode EXAM: CT HEAD WITHOUT CONTRAST TECHNIQUE: Contiguous axial images were obtained from the base of the skull through the vertex without intravenous contrast. COMPARISON:  11/29/2017 FINDINGS: Brain: No evidence of acute infarction, hemorrhage, hydrocephalus, extra-axial collection or mass lesion/mass effect. Vascular: No hyperdense vessel or unexpected calcification. Skull: Normal. Negative for fracture or focal lesion. Sinuses/Orbits: The orbits are within normal limits. Chronic mucosal thickening in the ethmoid sinuses is noted. Other: None IMPRESSION: Chronic mucosal thickening in the ethmoid sinuses. No acute intracranial abnormality is noted. Electronically Signed   By: Alcide Clever M.D.   On: 05/05/2018 11:55   Dg Chest Portable 1 View  Result Date: 05/05/2018 CLINICAL DATA:  Recent syncopal episode EXAM: PORTABLE CHEST 1 VIEW COMPARISON:  11/29/2017 FINDINGS: Cardiac shadows within normal limits. Aortic calcifications are seen. The lungs are well aerated bilaterally. Minimal atelectatic changes/scarring are seen in the right lung base. No acute bony abnormality is noted.  IMPRESSION: Atelectasis versus scarring in the right base. No other focal abnormality is noted. Electronically Signed   By: Alcide Clever M.D.   On: 05/05/2018 10:33   EKG: Independently reviewed. Sinus bradycardia   Assessment/Plan Active Problems:   Syncope and collapse   Suicidal risk   Symptomatic bradycardia   Cardiopulmonary arrest with successful resuscitation (HCC)  1. Syncope and collapse-patient had a severe syncopal episode earlier today requiring CPR and is now noted to have severe bradycardia however he reports that this is chronic.  He does have history of using marijuana.  I have asked for a urine drug screen to be done.  In addition, check a 2D echocardiogram.  Monitor telemetry.  Check a TSH.  Neurochecks ordered.  Check orthostatic vitals. 2. Symptomatic bradycardia- patient is currently reporting that he has had bradycardia for most of his life and has had no symptoms from it.  We are monitoring  him on telemetry at this time and treating with IV fluid hydration and supportive care.  Consider cardiology evaluation pending work-up. 3. Suicidal risk- patient apparently has been having suicidal ideations I have placed him on suicide precautions and when he is medically stabilized will ask for psychiatry evaluation.  At this facility he will need TTS evaluation. 4. Chronic depression and anxiety-patient was recently started on Celexa 20 mg and Klonopin 0.5 mg.  Hold Klonopin at this time as this has likely precipitated his syncopal episode today.  I have asked for a urine drug screen. 5. Marijuana usage- I have asked for a urine drug screen and will follow-up.  DVT Prophylaxis: Lovenox Code Status: Full Family Communication: Patient Disposition Plan: Pending medical work-up and disposition planning  Time spent: 56 minutes  Standley Dakins, MD [facility]  05/05/2018, 1:23 PM

## 2018-05-05 NOTE — ED Provider Notes (Signed)
[facility] EMERGENCY DEPARTMENT Provider Note   CSN: [id number] Arrival date & time: 05/05/18  4920     History   Chief Complaint Chief Complaint  Patient presents with  . Loss of Consciousness    HPI Benjamin Navarro is a 53 y.o. male with no significant past medical history per chart, was in route to an appointment with a local court house when he had a syncopal event when getting out of his car.  He has had reduced response since this event and appears very drowsy on my initial evaluation, however does respond to direct questions.  He was given atropine by EMS prior to arrival secondary to a pulse rate in the mid 40s.  He was also given 2.5 mg of Narcan and had improvement in his level of alertness briefly.  He endorses to me that he was started on a new medication yesterday after being seen at [facility], it is unclear what this medication was for but states he took the first dose yesterday.  Gives does not give any further history, just has complaint of chest pain.  The history is provided by the patient.    Past Medical History:  Diagnosis Date  . Renal disorder    kidney stones    There are no active problems to display for this patient.   Past Surgical History:  Procedure Laterality Date  . LITHOTRIPSY          Home Medications    Prior to Admission medications   Medication Sig Start Date End Date Taking? Authorizing Provider  citalopram (CELEXA) 20 MG tablet Take 20 mg by mouth daily. 05/04/18   [provider]  clonazePAM (KLONOPIN) 0.5 MG tablet Take 0.5 mg by mouth 2 (two) times daily as needed. for anxiety 05/04/18   [provider]  OLANZapine (ZYPREXA) 5 MG tablet Take 10 mg by mouth daily.    [provider]    Family History Family History  Problem Relation Age of Onset  . Hypertension Mother   . Hypertension Father   . Hypertension Sister   . Hypertension Brother     Social History Social History   Tobacco  Use  . Smoking status: Current Every Day Smoker    Packs/day: 2.00  . Smokeless tobacco: Never Used  Substance Use Topics  . Alcohol use: No  . Drug use: No     Allergies   Patient has no known allergies.   Review of Systems Review of Systems  Unable to perform ROS: Mental status change     Physical Exam Updated Vital Signs BP 136/81   Pulse (!) 48   Temp (!) 97.1 F (36.2 C) (Rectal)   Resp 14   SpO2 98%   Physical Exam Vitals signs and nursing note reviewed.  Constitutional:      Appearance: Normal appearance. He is well-developed.  HENT:     Head: Normocephalic and atraumatic.  Eyes:     Conjunctiva/sclera: Conjunctivae normal.     Pupils: Pupils are equal, round, and reactive to light.     Comments: Sluggish but reactive.  Neck:     Musculoskeletal: Normal range of motion.  Cardiovascular:     Rate and Rhythm: Regular rhythm. Bradycardia present.     Heart sounds: Normal heart sounds.  Pulmonary:     Effort: Pulmonary effort is normal.     Breath sounds: Normal breath sounds. No wheezing.  Chest:     Chest wall: Tenderness present.  Comments: Tender to palpation anterior chest wall.  There is no crepitus or deformity, no visible bruising. Abdominal:     General: Bowel sounds are normal.     Palpations: Abdomen is soft.     Tenderness: There is no abdominal tenderness.  Musculoskeletal: Normal range of motion.  Skin:    General: Skin is warm and dry.  Neurological:     General: No focal deficit present.     Mental Status: He is lethargic.     Comments: Unable to evaluate cranial nerves secondary to lethargy.  He did move all extremities without obvious weakness or neglect.    During initial evaluation, patient requested need to urinate and was cooperative urinating into a urinal.  ED Treatments / Results  Labs (all labs ordered are listed, but only abnormal results are displayed) Labs Reviewed  COMPREHENSIVE METABOLIC PANEL - Abnormal;  Notable for the following components:      Result Value   Calcium 8.8 (*)    All other components within normal limits  URINALYSIS, ROUTINE W REFLEX MICROSCOPIC - Abnormal; Notable for the following components:   Color, Urine STRAW (*)    All other components within normal limits  CULTURE, BLOOD (ROUTINE X 2)  CULTURE, BLOOD (ROUTINE X 2)  LIPASE, BLOOD  TROPONIN I  LACTIC ACID, PLASMA  RAPID URINE DRUG SCREEN, HOSP PERFORMED  CBG MONITORING, ED    EKG EKG Interpretation  Date/Time:  Tuesday May 05 2018 09:51:12 EST Ventricular Rate:  84 PR Interval:    QRS Duration: 88 QT Interval:  358 QTC Calculation: 424 R Axis:   69 Text Interpretation:  Sinus rhythm Ventricular premature complex Aberrant complex Probable left atrial enlargement Confirmed by Vanetta MuldersZackowski, Scott [phone]([id number]) on 05/05/2018 10:28:36 AM   Radiology Ct Head Wo Contrast  Result Date: 05/05/2018 CLINICAL DATA:  Recent syncopal episode EXAM: CT HEAD WITHOUT CONTRAST TECHNIQUE: Contiguous axial images were obtained from the base of the skull through the vertex without intravenous contrast. COMPARISON:  11/29/2017 FINDINGS: Brain: No evidence of acute infarction, hemorrhage, hydrocephalus, extra-axial collection or mass lesion/mass effect. Vascular: No hyperdense vessel or unexpected calcification. Skull: Normal. Negative for fracture or focal lesion. Sinuses/Orbits: The orbits are within normal limits. Chronic mucosal thickening in the ethmoid sinuses is noted. Other: None IMPRESSION: Chronic mucosal thickening in the ethmoid sinuses. No acute intracranial abnormality is noted. Electronically Signed   By: Alcide CleverMark  Lukens M.D.   On: 05/05/2018 11:55   Dg Chest Portable 1 View  Result Date: 05/05/2018 CLINICAL DATA:  Recent syncopal episode EXAM: PORTABLE CHEST 1 VIEW COMPARISON:  11/29/2017 FINDINGS: Cardiac shadows within normal limits. Aortic calcifications are seen. The lungs are well aerated bilaterally. Minimal  atelectatic changes/scarring are seen in the right lung base. No acute bony abnormality is noted. IMPRESSION: Atelectasis versus scarring in the right base. No other focal abnormality is noted. Electronically Signed   By: Alcide CleverMark  Lukens M.D.   On: 05/05/2018 10:33    Procedures Procedures (including critical care time)  Medications Ordered in ED Medications  naloxone [facility](NARCAN) injection 0.4 mg (0.4 mg Intravenous Given 05/05/18 1054)  flumazenil (ROMAZICON) injection 0.2 mg (0.2 mg Intravenous Given 05/05/18 1104)     Initial Impression / Assessment and Plan / ED Course  I have reviewed the triage vital signs and the nursing notes.  Pertinent labs & imaging results that were available during my care of the patient were reviewed by me and considered in my medical decision making (see chart for details).  Patient is very lethargic, although responds appropriately to direct questions and has been cooperative for exam.  10:59 AM  Sister and the friend (who drove him to the courthouse) now present.  Friend states that when she first picked him up this morning he appeared fatigued but was ambulatory, was complaining that his legs felt weak and heavy.  They proceeded to the court house, and as they were walking across the parking lot he became increasingly weak, she had to hold him and he collapsed and became unresponsive.  She states she and bystanders were unable to find a pulse and they started CPR while awaiting EMS arrival.  Sister states that he started 2 medications which she picked from the pharmacy yesterday for him, Celexa and Klonopin which were [facility] Sunday.  She instructed him to start taking these medications this morning.  She states that he has had lots of depression, frequently expresses suicide ideation and has had attempted overdoses in the past.  She cannot say if he possibly overdosed this morning.  There was no seizure-like activity during this morning's  event.  12:02 PM  Patient now completely awake and conversive.  His pulse rate was noted to be in the upper 30s to mid 40s, repeat EKG reflecting sinus bradycardia.  In discussion with the patient, he states he has had a low pulse rate "his whole life", stating sometimes it does get into the 30s and 40s.  He denies ever having any symptoms such as dizziness or syncope with this condition.  He also does endorse a longstanding history of depression over custody issues with his children.  Sister at the bedside endorses that just 2 days ago he threatened to jump off of a bridge.  He currently denies suicidal ideation.    Patient with a history of syncope and altered mental status, transient as he is now awake and alert.  He underwent CPR due to loss of pulse per bystanders prior to arrival.  He remains bradycardic but sinus while here.  Patient will need admission to rule out cardiac source for his symptoms.  He will benefit also from psych eval while here as well.     Final Clinical Impressions(s) / ED Diagnoses   Final diagnoses:  Syncope and collapse  Bradycardia  Transient alteration of awareness    ED Discharge Orders    None       Victoriano Lain 05/05/18 1229    Vanetta Mulders, MD 05/05/18 1420

## 2018-05-05 NOTE — ED Triage Notes (Addendum)
Pt found [place] court house and collapsed to ground.  1mg  atropine, 2.5 mg of narcan given by EMS with improvement.    CBG 119. Pt mildly responsive.

## 2018-05-05 NOTE — ED Notes (Signed)
Pt's HR 39 on the monitor. RN went to assess. Pt has no new complaints. EKG performed and given to Dr. Deretha Emory. Burgess Amor, PA and Dr. Deretha Emory notified.

## 2018-05-06 ENCOUNTER — Inpatient Hospital Stay (HOSPITAL_COMMUNITY): Payer: Self-pay

## 2018-05-06 ENCOUNTER — Inpatient Hospital Stay (HOSPITAL_COMMUNITY)

## 2018-05-06 ENCOUNTER — Encounter (HOSPITAL_COMMUNITY): Payer: Self-pay | Admitting: Behavioral Health

## 2018-05-06 DIAGNOSIS — E559 Vitamin D deficiency, unspecified: Secondary | ICD-10-CM | POA: Diagnosis present

## 2018-05-06 DIAGNOSIS — R55 Syncope and collapse: Secondary | ICD-10-CM

## 2018-05-06 LAB — CBC WITH DIFFERENTIAL/PLATELET
Abs Immature Granulocytes: 0.01 10*3/uL (ref 0.00–0.07)
Basophils Absolute: 0.1 10*3/uL (ref 0.0–0.1)
Basophils Relative: 1 %
Eosinophils Absolute: 0.5 10*3/uL (ref 0.0–0.5)
Eosinophils Relative: 9 %
HCT: 40.1 % (ref 39.0–52.0)
Hemoglobin: 12.3 g/dL — ABNORMAL LOW (ref 13.0–17.0)
Immature Granulocytes: 0 %
Lymphocytes Relative: 33 %
Lymphs Abs: 1.7 10*3/uL (ref 0.7–4.0)
MCH: 28.6 pg (ref 26.0–34.0)
MCHC: 30.7 g/dL (ref 30.0–36.0)
MCV: 93.3 fL (ref 80.0–100.0)
Monocytes Absolute: 0.4 10*3/uL (ref 0.1–1.0)
Monocytes Relative: 8 %
NEUTROS ABS: 2.5 10*3/uL (ref 1.7–7.7)
NEUTROS PCT: 49 %
Platelets: 226 10*3/uL (ref 150–400)
RBC: 4.3 MIL/uL (ref 4.22–5.81)
RDW: 15.1 % (ref 11.5–15.5)
WBC: 5.2 10*3/uL (ref 4.0–10.5)
nRBC: 0 % (ref 0.0–0.2)

## 2018-05-06 LAB — COMPREHENSIVE METABOLIC PANEL
ALT: 11 U/L (ref 0–44)
ANION GAP: 5 (ref 5–15)
AST: 15 U/L (ref 15–41)
Albumin: 3.1 g/dL — ABNORMAL LOW (ref 3.5–5.0)
Alkaline Phosphatase: 47 U/L (ref 38–126)
BUN: 14 mg/dL (ref 6–20)
CHLORIDE: 109 mmol/L (ref 98–111)
CO2: 23 mmol/L (ref 22–32)
Calcium: 8.2 mg/dL — ABNORMAL LOW (ref 8.9–10.3)
Creatinine, Ser: 1.01 mg/dL (ref 0.61–1.24)
GFR calc Af Amer: >60 mL/min (ref >60)
GFR calc non Af Amer: >60 mL/min (ref >60)
Glucose, Bld: 127 mg/dL — ABNORMAL HIGH (ref 70–99)
POTASSIUM: 4.3 mmol/L (ref 3.5–5.1)
Sodium: 137 mmol/L (ref 135–145)
Total Bilirubin: 0.2 mg/dL — ABNORMAL LOW (ref 0.3–1.2)
Total Protein: 6.1 g/dL — ABNORMAL LOW (ref 6.5–8.1)

## 2018-05-06 LAB — MAGNESIUM: Magnesium: 2 mg/dL (ref 1.7–2.4)

## 2018-05-06 LAB — ECHOCARDIOGRAM COMPLETE
Height: 68 in
Weight: 2190.4 oz

## 2018-05-06 LAB — VITAMIN D 25 HYDROXY (VIT D DEFICIENCY, FRACTURES): VIT D 25 HYDROXY: 7 ng/mL — AB (ref 30.0–100.0)

## 2018-05-06 LAB — HIV ANTIBODY (ROUTINE TESTING W REFLEX): HIV Screen 4th Generation wRfx: NONREACTIVE

## 2018-05-06 MED ORDER — VITAMIN D (ERGOCALCIFEROL) 1.25 MG (50000 UNIT) PO CAPS: 50000.0000 [IU] | ORAL_CAPSULE | ORAL | Status: DC

## 2018-05-06 MED ORDER — VITAMIN D (ERGOCALCIFEROL) 1.25 MG (50000 UNIT) PO CAPS: 50000.0000 [IU] | ORAL_CAPSULE | ORAL | 0 refills | Status: AC

## 2018-05-06 NOTE — Progress Notes (Signed)
Patient ambulated approximately 600 ft independently. Resting HR 57. HR during ambulation 97. Pt denies any symptoms.

## 2018-05-06 NOTE — Discharge Instructions (Signed)
Please wear 30 day monitor that will be sent to your home.   Please follow up with cardiology for recheck in 1-2 weeks.  Seek medical care or return to ER if symptoms come back, worsen or new problems develop. Please go [facility] as soon as possible for recheck and to get new medications.  Please stop taking clonazepam.    Ambulatory Cardiac Monitoring An ambulatory cardiac monitor is a small recording device that is used to detect abnormal heart rhythms (arrhythmias). Most monitors are connected by wires to flat, sticky disks (electrodes) that are then attached to your chest. You may need to wear a monitor if you have had symptoms such as:  Fast heartbeats (palpitations).  Dizziness.  Fainting or light-headedness.  Unexplained weakness.  Shortness of breath. There are several types of monitors. Some common monitors include:  Holter monitor. This records your heart rhythm continuously, usually for 24-48 hours.  Event (episodic) monitor. This monitor has a symptoms button, and when pushed, it will begin recording. You need to activate this monitor to record when you have a heart-related symptom.  Automatic detection monitor. This monitor will begin recording when it detects an abnormal heartbeat. What are the risks? Generally, these devices are safe to use. However, it is possible that the skin under the electrodes will become irritated. How to prepare for monitoring Your health care provider will prepare your chest for the electrode placement and show you how to use the monitor.  Do not apply lotions to your chest before monitoring.  Follow directions on how to care for the monitor, and how to return the monitor when the testing period is complete. How to use your cardiac monitor  Follow directions about how long to wear the monitor, and if you can take the monitor off in order to shower or bathe. ? Do not let the monitor get wet. ? Do not bathe, swim, or use a hot tub while  wearing the monitor.  Keep your skin clean. Do not put body lotion or moisturizer on your chest.  Change the electrodes as told by your health care provider, or any time they stop sticking to your skin. You may need to use medical tape to keep them on.  Try to put the electrodes in slightly different places on your chest to help prevent skin irritation. Follow directions from your health care provider about where to place the electrodes.  Make sure the monitor is safely clipped to your clothing or in a location close to your body as recommended by your health care provider.  If your monitor has a symptoms button, press the button to mark an event as soon as you feel a heart-related symptom, such as: ? Dizziness. ? Weakness. ? Light-headedness. ? Palpitations. ? Thumping or pounding in your chest. ? Shortness of breath. ? Unexplained weakness.  Keep a diary of your activities, such as walking, doing chores, and taking medicine. It is very important to note what you were doing when you pushed the button to record your symptoms. This will help your health care provider determine what might be contributing to your symptoms.  Send the recorded information as recommended by your health care provider. It may take some time for your health care provider to process the results.  Change the batteries as told by your health care provider.  Keep electronic devices away from your monitor. These include: ? Tablets. ? MP3 players. ? Cell phones.  While wearing your monitor you should avoid: ?  Electric blankets. ? Firefighter. ? Electric toothbrushes. ? Microwave ovens. ? Magnets. ? Metal detectors. Get help right away if:  You have chest pain.  You have shortness of breath or extreme difficulty breathing.  You develop a very fast heartbeat that does not get better.  You develop dizziness that does not go away.  You faint or constantly feel like you are about to  faint. Summary  An ambulatory cardiac monitor is a small recording device that is used to detect abnormal heart rhythms (arrhythmias).  Make sure you understand how to send the information from the monitor to your health care provider.  It is important to press the button on the monitor when you have any heart-related symptoms.  Keep a diary of your activities, such as walking, doing chores, and taking medicine. It is very important to note what you were doing when you pushed the button to record your symptoms. This will help your health care provider learn what might be causing your symptoms. This information is not intended to replace advice given to you by your health care provider. Make sure you discuss any questions you have with your health care provider. Document Released: 01/09/2008 Document Revised: 01/15/2017 Document Reviewed: 03/16/2016 Elsevier Interactive Patient Education  2019 Elsevier Inc[employer]     Bradycardia, Adult Bradycardia is a slower-than-normal heartbeat. A normal resting heart rate for an adult ranges from 60 to 100 beats per minute. With bradycardia, the resting heart rate is less than 60 beats per minute. Bradycardia can prevent enough oxygen from reaching certain areas of your body when you are active. It can be serious if it keeps enough oxygen from reaching your brain and other parts of your body. Bradycardia is not a problem for everyone. For some healthy adults, a slow resting heart rate is normal. What are the causes? This condition may be caused by:  A problem with the heart, including: ? A problem with the heart's electrical system, such as a heart block. With a heart block, electrical signals between the chambers of the heart are partially or completely blocked, so they are not able to work as they should. ? A problem with the heart's natural pacemaker (sinus node). ? Heart disease. ? A heart attack. ? Heart damage. ? Lyme disease. ? A heart  infection. ? A heart condition that is present at birth (congenital heart defect).  Certain medicines that treat heart conditions.  Certain conditions, such as hypothyroidism and obstructive sleep apnea.  Problems with the balance of chemicals and other substances, like potassium, in the blood.  Trauma.  Radiation therapy. What increases the risk? You are more likely to develop this condition if you:  Are age 16 or older.  Have high blood pressure (hypertension), high cholesterol (hyperlipidemia), or diabetes.  Drink heavily, use tobacco or nicotine products, or use drugs. What are the signs or symptoms? Symptoms of this condition include:  Light-headedness.  Feeling faint or fainting.  Fatigue and weakness.  Trouble with activity or exercise.  Shortness of breath.  Chest pain (angina).  Drowsiness.  Confusion.  Dizziness. How is this diagnosed? This condition may be diagnosed based on:  Your symptoms.  Your medical history.  A physical exam. During the exam, your health care provider will listen to your heartbeat and check your pulse. To confirm the diagnosis, your health care provider may order tests, such as:  Blood tests.  An electrocardiogram (ECG). This test records the heart's electrical activity. The test can show how  fast your heart is beating and whether the heartbeat is steady.  A test in which you wear a portable device (event recorder or Holter monitor) to record your heart's electrical activity while you go about your day.  Anexercise test. How is this treated? Treatment for this condition depends on the cause of the condition and how severe your symptoms are. Treatment may involve:  Treatment of the underlying condition.  Changing your medicines or how much medicine you take.  Having a small, battery-operated device called a pacemaker implanted under the skin. When bradycardia occurs, this device can be used to increase your heart rate  and help your heart beat in a regular rhythm. Follow these instructions at home: Lifestyle   Manage any health conditions that contribute to bradycardia as told by your health care provider.  Follow a heart-healthy diet. A nutrition specialist (dietitian) can help educate you about healthy food options and changes.  Follow an exercise program that is approved by your health care provider.  Maintain a healthy weight.  Try to reduce or manage your stress, such as with yoga or meditation. If you need help reducing stress, ask your health care provider.  Do not use any products that contain nicotine or tobacco, such as cigarettes, e-cigarettes, and chewing tobacco. If you need help quitting, ask your health care provider.  Do not use illegal drugs.  Limit alcohol intake to no more than 1 drink a day for nonpregnant women and 2 drinks a day for men. Be aware of how much alcohol is in your drink. In the [place] one drink equals one 12 oz bottle of beer (355 mL), one 5 oz glass of wine (148 mL), or one 1 oz glass of hard liquor (44 mL). General instructions  Take over-the-counter and prescription medicines only as told by your health care provider.  Keep all follow-up visits as told by your health care provider. This is important. How is this prevented? In some cases, bradycardia may be prevented by:  Treating underlying medical problems.  Stopping behaviors or medicines that can trigger the condition. Contact a health care provider if you:  Feel light-headed or dizzy.  Almost faint.  Feel weak or are easily fatigued during physical activity.  Experience confusion or have memory problems. Get help right away if:  You faint.  You have: ? An irregular heartbeat (palpitations). ? Chest pain. ? Trouble breathing. Summary  Bradycardia is a slower-than-normal heartbeat. With bradycardia, the resting heart rate is less than 60 beats per minute.  Treatment for this condition  depends on the cause.  Manage any health conditions that contribute to bradycardia as told by your health care provider.  Do not use any products that contain nicotine or tobacco, such as cigarettes, e-cigarettes, and chewing tobacco, and limit alcohol intake.  Keep all follow-up visits as told by your health care provider. This is important. This information is not intended to replace advice given to you by your health care provider. Make sure you discuss any questions you have with your health care provider. Document Released: 12/22/2001 Document Revised: 09/10/2017 Document Reviewed: 09/10/2017 Elsevier Interactive Patient Education  2019 [employer].

## 2018-05-06 NOTE — Care Management Note (Signed)
Case Management Note  Patient Details  Name: Benjamin Navarro MRN: [id number] Date of Birth: [birth year]  Subjective/Objective:   Admitted with bradycardia. Pt is from home, uninsured, has no PCP. Employed FT. Pt recently went to [facility] and plans to cont to follow up there. Pt will need follow up for bradycardia.                  Action/Plan: FC will see pt for screening. Pt referred to [facility] to establish follow up care. Pt's only new Rx is for Vit D. - no eligible for MATCH coverage.   Expected Discharge Date:  05/06/18               Expected Discharge Plan:  Home/Self Care  In-House Referral:  Financial Counselor  Discharge planning Services  CM Consult, Indigent Health Clinic  Post Acute Care Choice:  NA Choice offered to:  NA  Status of Service:  Completed, signed off  If discussed at Long Length of Stay Meetings, dates discussed:    Additional Comments:  Malcolm Metro, RN 05/06/2018, 3:43 PM

## 2018-05-06 NOTE — BH Assessment (Signed)
Tele Assessment Note   Patient Name: Benjamin Navarro MRN: [id number] Referring Physician: EDP Location of Patient: [facility] Med Floor Location of Provider: Behavioral Health [name]  Benjamin Navarro is an 53 y.o. male who presented to [facility] for medical issues.  A consult was called because Pt expressed suicidal ideation.  Pt lives alone in [place].  He is employed, and he currently receives outpatient services through [facility] for treatment of depression.  Per report,, Pt expressed suicidal ideation to his cousin over the weekend.  During assessment, Pt acknowledged that he may have made a suicidal statement to cousin, but also stated, ''That was days ago.'' Pt denied current suicidal ideation.  He also denied past suicide attempts.  Pt denied any homicidal ideation, hallucination, or self-injurious behavior.  Pt's sleep and appetite were within normal range.  Pt stated that he is compliant with medication prescribed by [facility].  He denied any current disturbance of mood or thought processes.  During assessment, Pt presented as alert and oriented.  He had good eye contact and was cooperative.  Pt's mood was euthymic, and affect was pleasant and calm.  Pt expressed a desire to be discharged so he could return to work.  He denied suicidal ideation, past suicide attempt, current mood disturbance (although endorsed history of treatment of despondency), hallucination..  Pt's speech was normal in rate, rhythm, and volume.  Thought processes were within normal range, and thought content was logical and goal-oriented.  There was no evidence of delusion.  Pt's memory and concentration intact.  Insight, judgment, and impulse control were good.  Consulted with S. Rankin, NP, who also spoke with Pt.  Pt does not meet inpatient criteria and is psych-cleared.  Diagnosis: Major Depressive Disorder, Mild  Past Medical History:  Past Medical History:  Diagnosis Date  . Renal disorder    kidney stones    Past  Surgical History:  Procedure Laterality Date  . LITHOTRIPSY      Family History:  Family History  Problem Relation Age of Onset  . Hypertension Mother   . Hypertension Father   . Hypertension Sister   . Hypertension Brother     Social History:  reports that he has been smoking. He has been smoking about 2.00 packs per day. He has never used smokeless tobacco. He reports that he does not drink alcohol or use drugs.  Additional Social History:  Alcohol / Drug Use Pain Medications: See MAR Prescriptions: See MAR Over the Counter: See MAR History of alcohol / drug use?: No history of alcohol / drug abuse  CIWA: CIWA-Ar BP: (!) 95/54 Pulse Rate: (!) 41 COWS:    Allergies:  Allergies  Allergen Reactions  . Penicillin G Hives    .Did it involve swelling of the face/tongue/throat, SOB, or low BP? No Did it involve sudden or severe rash/hives, skin peeling, or any reaction on the inside of your mouth or nose? Yes Did you need to seek medical attention at a hospital or doctor's office? Yes When did it last happen?2019 If all above answers are "NO", may proceed with cephalosporin use.   . Iodinated Diagnostic Agents Rash    Home Medications:  Medications Prior to Admission  Medication Sig Dispense Refill  . clonazePAM (KLONOPIN) 0.5 MG tablet Take 0.5 mg by mouth 2 (two) times daily as needed. for anxiety    . citalopram (CELEXA) 20 MG tablet Take 20 mg by mouth daily.    Marland Kitchen OLANZapine (ZYPREXA) 5 MG tablet Take 10  mg by mouth daily.      OB/GYN Status:  No LMP for male patient.  General Assessment Data Location of Assessment: [facility] TTS Assessment: In system Is this a Tele or Face-to-Face Assessment?: Tele Assessment Is this an Initial Assessment or a Re-assessment for this encounter?: Initial Assessment Patient Accompanied by:: N/A Language Other than English: No Living Arrangements: Other (Comment)(Alone) What gender do you identify as?:  Male Marital status: Single Pregnancy Status: No Living Arrangements: Alone Can pt return to current living arrangement?: Yes Admission Status: Voluntary Is patient capable of signing voluntary admission?: Yes Referral Source: Medical Floor Inpatient Insurance type: MCD Potential     Crisis Care Plan Living Arrangements: Alone Name of Psychiatrist: [facility] Name of Therapist: None  Education Status Is patient currently in school?: No Is the patient employed, unemployed or receiving disability?: Employed  Risk to self with the past 6 months Suicidal Ideation: No-Not Currently/Within Last 6 Months Has patient been a risk to self within the past 6 months prior to admission? : Yes Suicidal Intent: No Has patient had any suicidal intent within the past 6 months prior to admission? : No Is patient at risk for suicide?: No Suicidal Plan?: No Has patient had any suicidal plan within the past 6 months prior to admission? : No Access to Means: No Previous Attempts/Gestures: No Intentional Self Injurious Behavior: None Family Suicide History: Unknown Recent stressful life event(s): Recent negative physical changes Persecutory voices/beliefs?: No Depression: Yes Depression Symptoms: Despondent Substance abuse history and/or treatment for substance abuse?: No Suicide prevention information given to non-admitted patients: Not applicable  Risk to Others within the past 6 months Homicidal Ideation: No Does patient have any lifetime risk of violence toward others beyond the six months prior to admission? : No Thoughts of Harm to Others: No Current Homicidal Intent: No Current Homicidal Plan: No Access to Homicidal Means: No History of harm to others?: No Assessment of Violence: None Noted Does patient have access to weapons?: No Criminal Charges Pending?: No Does patient have a court date: No Is patient on probation?: No  Psychosis Hallucinations: None noted Delusions: None  noted  Mental Status Report Appearance/Hygiene: Unremarkable, In hospital gown Eye Contact: Fair Motor Activity: Freedom of movement, Unremarkable Speech: Logical/coherent Level of Consciousness: Alert Mood: Euthymic Affect: Appropriate to circumstance Anxiety Level: None Thought Processes: Coherent, Relevant Judgement: Unimpaired Orientation: Person, Place, Time, Situation Obsessive Compulsive Thoughts/Behaviors: None  Cognitive Functioning Concentration: Normal Memory: Recent Intact, Remote Intact Is patient IDD: No Insight: Good Impulse Control: Good Appetite: Good Have you had any weight changes? : No Change Sleep: No Change Vegetative Symptoms: None  ADLScreening [facility]) Patient's cognitive ability adequate to safely complete daily activities?: Yes Patient able to express need for assistance with ADLs?: Yes Independently performs ADLs?: Yes (appropriate for developmental age)  Prior Inpatient Therapy Prior Inpatient Therapy: Yes Prior Therapy Dates: 1990 approx Reason for Treatment: depression  Prior Outpatient Therapy Prior Outpatient Therapy: Yes Prior Therapy Dates: Ongoing Prior Therapy Facilty/Provider(s): [facility] Reason for Treatment: Depression Does patient have an ACCT team?: No Does patient have Intensive In-House Services?  : No Does patient have [facility] services? : No Does patient have P4CC services?: No  ADL Screening (condition at time of admission) Patient's cognitive ability adequate to safely complete daily activities?: Yes Is the patient deaf or have difficulty hearing?: No Does the patient have difficulty seeing, even when wearing glasses/contacts?: No Does the patient have difficulty concentrating, remembering, or making decisions?:  No Patient able to express need for assistance with ADLs?: Yes Does the patient have difficulty dressing or bathing?: No Independently performs ADLs?: Yes (appropriate for developmental  age) Does the patient have difficulty walking or climbing stairs?: No Weakness of Legs: None Weakness of Arms/Hands: None  Home Assistive Devices/Equipment Home Assistive Devices/Equipment: None  Therapy Consults (therapy consults require a physician order) PT Evaluation Needed: No OT Evalulation Needed: No SLP Evaluation Needed: No Abuse/Neglect Assessment (Assessment to be complete while patient is alone) Abuse/Neglect Assessment Can Be Completed: Yes Physical Abuse: Denies Verbal Abuse: Denies Sexual Abuse: Denies Exploitation of patient/patient's resources: Denies Self-Neglect: Denies Values / Beliefs Cultural Requests During Hospitalization: None Spiritual Requests During Hospitalization: None Consults Spiritual Care Consult Needed: No Social Work Consult Needed: No Merchant navy officerAdvance Directives (For Healthcare) Does Patient Have a Medical Advance Directive?: No Would patient like information on creating a medical advance directive?: No - Patient declined Nutrition Screen- [facility] Adult/[facility] Patient's home diet: Regular Has the patient recently lost weight without trying?: No Has the patient been eating poorly because of a decreased appetite?: No Malnutrition Screening Tool Score: 0        Disposition:  Disposition Initial Assessment Completed for this Encounter: Yes Disposition of Patient: Discharge(Per S. Rankin, NP, Pt does not meet inpt criteria)  This service was provided via telemedicine using a 2-way, interactive audio and video technology.  Names of all persons participating in this telemedicine service and their role in this encounter. Name: Jone BasemanArtis, Inocente Role: Patient             Earline Mayotteugene T Tamarra Geiselman 05/06/2018 1:05 PM

## 2018-05-06 NOTE — Evaluation (Signed)
Physical Therapy Evaluation Patient Details Name: Benjamin Navarro MRN: [id number] DOB: 26-Apr-1965 Today's Date: 05/06/2018   History of Present Illness  Benjamin Navarro is a 53 y.o. male with history of nephrolithiasis and depression was in route to an appointment with a local court house when he had a syncopal event when getting out of his car and going to the courtroom.   His family says that he has had bradycardia for all of his life.  He had been lethargic since this event and appears very drowsy, however does respond to direct questions.  He had taken a klonipin earlier.  He apparently did NOT try to overdose.  His pills were counted and the numbers appeared appropriate.  He was given atropine by EMS prior to arrival secondary to a pulse rate in the mid 40s.  He was also given 2.5 mg of Narcan and had improvement in his level of alertness briefly.  He told family members 2 days ago that he was going to jump off of a bridge and he was [facility] where he had been prescribed celexa and klonipin.  He had just taken the first klonipin tablet this morning prior to the syncopal event.  The RN found marijuana in his pill bottle.    Clinical Impression  Pt admitted with above diagnosis, was received in bed, and was agreeable to PT evaluation. PTA pt independent with all ADLs and IADLs, full community ambulator without assistance. Pt has h/o sinus bradycardia at baseline. Currently, pt able to perform all bed mobility, transfers, and gait at mod I - I level, no evidence of unsteadiness or LOB, and no s/s of dizziness or feelings of syncope. HR once returned to room was 51bpm. Left pt in chair with call bell within reach. Pt educated to call RN or RN Tech if he wanted to walk again to assist with his IV pole and he verbalized understanding. Pt does not need acute or f/u PT services. Will sign off acutely and d/c pt to RN for daily walking (RN made aware).      Follow Up Recommendations No PT  follow up    Equipment Recommendations  None recommended by PT    Recommendations for Other Services       Precautions / Restrictions Precautions Precautions: None Precaution Comments: assistance with IV pole      Mobility  Bed Mobility Overal bed mobility: Independent                Transfers Overall transfer level: Independent                  Ambulation/Gait Ambulation/Gait assistance: Modified independent (Device/Increase time) Gait Distance (Feet): 500 Feet Assistive device: None Gait Pattern/deviations: WFL(Within Functional Limits)     General Gait Details: decreased gait speed for his age, but pt stating this is his regular gait speed; no LOB, steady throughout  Stairs            Wheelchair Mobility    Modified Rankin (Stroke Patients Only)       Balance Overall balance assessment: No apparent balance deficits (not formally assessed)                                           Pertinent Vitals/Pain Pain Assessment: No/denies pain    Home Living Family/patient expects to be discharged to:: Private  residence Living Arrangements: Alone Available Help at Discharge: Family;Available 24 hours/day Type of Home: House       Home Layout: One level Home Equipment: None      Prior Function Level of Independence: Independent         Comments: independent with all ADLs and IADLs     Hand Dominance   Dominant Hand: Right    Extremity/Trunk Assessment   Upper Extremity Assessment Upper Extremity Assessment: Overall WFL for tasks assessed    Lower Extremity Assessment Lower Extremity Assessment: Overall WFL for tasks assessed       Communication   Communication: No difficulties  Cognition Arousal/Alertness: Awake/alert Behavior During Therapy: WFL for tasks assessed/performed Overall Cognitive Status: Within Functional Limits for tasks assessed                                         General Comments      Exercises     Assessment/Plan    PT Assessment Patent does not need any further PT services  PT Problem List         PT Treatment Interventions      PT Goals (Current goals can be found in the Care Plan section)  Acute Rehab PT Goals Patient Stated Goal: to go home PT Goal Formulation: With patient Time For Goal Achievement: 05/13/18 Potential to Achieve Goals: Good    Frequency     Barriers to discharge        Co-evaluation               AM-PAC PT "6 Clicks" Mobility  Outcome Measure Help needed turning from your back to your side while in a flat bed without using bedrails?: None Help needed moving from lying on your back to sitting on the side of a flat bed without using bedrails?: None Help needed moving to and from a bed to a chair (including a wheelchair)?: None Help needed standing up from a chair using your arms (e.g., wheelchair or bedside chair)?: None Help needed to walk in hospital room?: None Help needed climbing 3-5 steps with a railing? : None 6 Click Score: 24    End of Session Equipment Utilized During Treatment: Gait belt Activity Tolerance: Patient tolerated treatment well Patient left: in chair;with call bell/phone within reach Nurse Communication: Mobility status PT Visit Diagnosis: Muscle weakness (generalized) (M62.81)    Time: [id number] PT Time Calculation (min) (ACUTE ONLY): 18 min   Charges:   PT Evaluation $PT Eval Low Complexity: 1 Low           Jac CanavanBrooke  PT, DPT

## 2018-05-06 NOTE — Consult Note (Addendum)
Cardiology Consult    Patient ID: Benjamin Navarro; [id number]; 1965/07/29   Admit date: 05/05/2018 Date of Consult: 05/06/2018  Primary Care Provider: Patient, No Pcp Per Primary Cardiologist: [facility] - Dr. Wyline MoodBranch  Patient Profile    Benjamin Navarro is a 53 y.o. male with past medical history of chronic bradycardia, depression, and tobacco use who is being seen today for the evaluation of syncope and bradycardia at the request of Dr. Laural BenesJohnson.   History of Present Illness    Benjamin Navarro reports being in his usual state of health until yesterday morning when he took Klonopin for the first time and had a syncopal episode later that morning. He had just got out of the car and was walking into the court house and says he developed acute blurred vision and weakness along his legs and passed out. There was concern that he lost a pulse and CPR was started by bystanders. Upon EMS arrival, he was noted to have a pulse and was more responsive. He did receive Narcan x2 and Atropine x1 as his pulse rate was in the 30's.  By review of notes, he had expressed suicidal ideations this past weekend and was evaluated by Benjamin Navarro and prescribed Celexa and Klonopin. Yesterday morning was his first dose of the medications.  In regards to his cardiac history, he reports having bradycardia since he was a teenager and says his heart rate is typically in the 30's to 40's and can increase to the 70's at times. He is active at baseline in working at a [employer] in [place] along [place] and says he walks for a majority of the Benjamin. He denies any recent chest pain or dyspnea on exertion. No prior syncopal episodes. No recent orthopnea, PND, or lower extremity edema. He does report occasional palpitations and says he consumes 9 to 10 cups of coffee per Benjamin.  He is unaware of any family history of CAD or cardiac arrhythmias. No personal history of HTN, HLD, or Type 2 DM. He does smoke 2 packs of  cigarettes per Benjamin and has done so for 30+ years.  Initial labs show WBC 4.9, Hgb 15.4, platelets 257, Na+ 140, K+ 4.3, and creatinine 0.96.  UDS positive for THC.  TSH 1.017. Initial troponin negative. CXR showed atelectasis versus scarring in the right base with no focal abnormalities. CT Head showed no acute intracranial abnormalities. EKG showed normal sinus rhythm, HR 84, with no acute ST changes when compared to prior tracings. Repeat tracing later that Benjamin showed sinus bradycardia, heart rate 43.  He has been followed on telemetry overnight with HR in the mid-30's to 50's. No significant pauses or episodes of CHB.    Past Medical History:  Diagnosis Date  . Renal disorder    kidney stones    Past Surgical History:  Procedure Laterality Date  . LITHOTRIPSY       Home Medications:  Prior to Admission medications   Medication Sig Start Date End Date Taking? Authorizing Provider  clonazePAM (KLONOPIN) 0.5 MG tablet Take 0.5 mg by mouth 2 (two) times daily as needed. for anxiety 05/04/18  Yes [provider]  citalopram (CELEXA) 20 MG tablet Take 20 mg by mouth daily. 05/04/18   [provider]  OLANZapine (ZYPREXA) 5 MG tablet Take 10 mg by mouth daily.    [provider]    Inpatient Medications: Scheduled Meds: . citalopram  20 mg  Oral Daily  . enoxaparin (LOVENOX) injection  40 mg Subcutaneous Q24H  . nicotine  21 mg Transdermal Daily  . [START ON 05/07/2018] Vitamin D (Ergocalciferol)  50,000 Units Oral Once per Benjamin on Mon Thu   Continuous Infusions: . sodium chloride 75 mL/hr at 05/06/18 0447   PRN Meds: acetaminophen **OR** acetaminophen, ondansetron **OR** ondansetron (ZOFRAN) IV, polyethylene glycol, traZODone  Allergies:    Allergies  Allergen Reactions  . Penicillin G Hives    .Did it involve swelling of the face/tongue/throat, SOB, or low BP? No Did it involve sudden or severe rash/hives, skin peeling, or any reaction on the inside of  your mouth or nose? Yes Did you need to seek medical attention at a hospital or doctor's office? Yes When did it last happen?2019 If all above answers are "NO", may proceed with cephalosporin use.   . Iodinated Diagnostic Agents Rash    Social History:   Social History   Socioeconomic History  . Marital status: Single    Spouse name: Not on file  . Number of children: Not on file  . Years of education: Not on file  . Highest education level: Not on file  Occupational History  . Not on file  Social Needs  . Financial resource strain: Not on file  . Food insecurity:    Worry: Not on file    Inability: Not on file  . Transportation needs:    Medical: Not on file    Non-medical: Not on file  Tobacco Use  . Smoking status: Current Every Benjamin Smoker    Packs/Benjamin: 2.00  . Smokeless tobacco: Never Used  Substance and Sexual Activity  . Alcohol use: No  . Drug use: No  . Sexual activity: Not on file  Lifestyle  . Physical activity:    Days per week: Not on file    Minutes per session: Not on file  . Stress: Not on file  Relationships  . Social connections:    Talks on phone: Not on file    Gets together: Not on file    Attends religious service: Not on file    Active member of club or organization: Not on file    Attends meetings of clubs or organizations: Not on file    Relationship status: Not on file  . Intimate partner violence:    Fear of current or ex partner: Not on file    Emotionally abused: Not on file    Physically abused: Not on file    Forced sexual activity: Not on file  Other Topics Concern  . Not on file  Social History Narrative  . Not on file     Family History:    Family History  Problem Relation Age of Onset  . Hypertension Mother   . Hypertension Father   . Hypertension Sister   . Hypertension Brother       Review of Systems    General:  No chills, fever, night sweats or weight changes. Positive for syncope.  Cardiovascular:   No chest pain, dyspnea on exertion, edema, orthopnea, palpitations, paroxysmal nocturnal dyspnea. Dermatological: No rash, lesions/masses Respiratory: No cough, dyspnea Urologic: No hematuria, dysuria Abdominal:   No nausea, vomiting, diarrhea, bright red blood per rectum, melena, or hematemesis Neurologic:  No visual changes, wkns, changes in mental status. All other systems reviewed and are otherwise negative except as noted above.  Physical Exam/Data    Vitals:   05/05/18 1600 05/05/18 1619 05/05/18 1620 05/06/18 0457  BP: (!) 103/49  105/63 (!) 95/54  Pulse: (!) 41  (!) 52 (!) 41  Resp: 14  17 15   Temp:   98.4 F (36.9 C) 98.1 F (36.7 C)  TempSrc:   Oral   SpO2: 100%  99% 100%  Weight:  62.1 kg    Height:  5\' 8"  (1.727 m)      Intake/Output Summary (Last 24 hours) at 05/06/2018 0745 Last data filed at 05/06/2018 0600 Gross per 24 hour  Intake 1000 ml  Output 950 ml  Net 50 ml   Filed Weights   05/05/18 1619  Weight: 62.1 kg   Body mass index is 20.82 kg/m.   General: Pleasant, African American male appearing in NAD Psych: Normal affect. Neuro: Alert and oriented X 3. Moves all extremities spontaneously. HEENT: Normal  Neck: Supple without bruits or JVD. Lungs:  Resp regular and unlabored, CTA without wheezing or rales. Heart: Regular rhythm, bradycardiac rate. No s3, s4, or murmurs. Abdomen: Soft, non-tender, non-distended, BS + x 4.  Extremities: No clubbing, cyanosis or lower extremity edema. DP/PT/Radials 2+ and equal bilaterally.   Labs/Studies     Relevant CV Studies:  Echocardiogram: Pending  Laboratory Data:  Chemistry Recent Labs  Lab 05/05/18 1008  NA 140  K 4.3  CL 106  CO2 24  GLUCOSE 90  BUN 10  CREATININE 0.96  CALCIUM 8.8*  GFRNONAA >60  GFRAA >60  ANIONGAP 10    Recent Labs  Lab 05/05/18 1008  PROT 7.4  ALBUMIN 3.8  AST 18  ALT 12  ALKPHOS 68  BILITOT 0.5   Hematology Recent Labs  Lab 05/05/18 1024  WBC 4.9    RBC 5.36  HGB 15.4  HCT 50.2  MCV 93.7  MCH 28.7  MCHC 30.7  RDW 15.3  PLT 257   Cardiac Enzymes Recent Labs  Lab 05/05/18 1008  TROPONINI <0.03   No results for input(s): TROPIPOC in the last 168 hours.  BNPNo results for input(s): BNP, PROBNP in the last 168 hours.  DDimer No results for input(s): DDIMER in the last 168 hours.  Radiology/Studies:  Ct Head Wo Contrast  Result Date: 05/05/2018 CLINICAL DATA:  Recent syncopal episode EXAM: CT HEAD WITHOUT CONTRAST TECHNIQUE: Contiguous axial images were obtained from the base of the skull through the vertex without intravenous contrast. COMPARISON:  11/29/2017 FINDINGS: Brain: No evidence of acute infarction, hemorrhage, hydrocephalus, extra-axial collection or mass lesion/mass effect. Vascular: No hyperdense vessel or unexpected calcification. Skull: Normal. Negative for fracture or focal lesion. Sinuses/Orbits: The orbits are within normal limits. Chronic mucosal thickening in the ethmoid sinuses is noted. Other: None IMPRESSION: Chronic mucosal thickening in the ethmoid sinuses. No acute intracranial abnormality is noted. Electronically Signed   By: Alcide CleverMark  Lukens M.D.   On: 05/05/2018 11:55   Portable Chest 1 View  Result Date: 05/06/2018 CLINICAL DATA:  Recent CPR EXAM: PORTABLE CHEST 1 VIEW COMPARISON:  05/05/2018 FINDINGS: Cardiac shadow is stable. Mild aortic calcifications are seen. The lungs are well aerated bilaterally. No focal infiltrate or effusion is seen. Stable atelectasis/scarring in the right base is seen. No definitive pneumothorax is noted. No rib abnormality is seen. IMPRESSION: No new focal abnormality noted. Electronically Signed   By: Alcide CleverMark  Lukens M.D.   On: 05/06/2018 07:25   Dg Chest Portable 1 View  Result Date: 05/05/2018 CLINICAL DATA:  Recent syncopal episode EXAM: PORTABLE CHEST 1 VIEW COMPARISON:  11/29/2017 FINDINGS: Cardiac shadows within normal limits. Aortic calcifications are seen. The  lungs are well  aerated bilaterally. Minimal atelectatic changes/scarring are seen in the right lung base. No acute bony abnormality is noted. IMPRESSION: Atelectasis versus scarring in the right base. No other focal abnormality is noted. Electronically Signed   By: Alcide Clever M.D.   On: 05/05/2018 10:33     Assessment & Plan    1. Syncope/ Chronic Bradycardia - he has a history of chronic bradycardia per his report and EKG's dating back to 2012 show HR in the 50's. He had a syncopal episode yesterday and denies any associated chest pain, dyspnea, or palpitations at that time but says he had felt dizzy and nauseated since taking Klonopin earlier that morning. Notes mention CPR was started by bystanders due to concern for him losing a pulse but this was present and he was response by EMS arrival.  - Labs show WBC 4.9, Hgb 15.4, platelets 257, Na+ 140, K+ 4.3, and creatinine 0.96. UDS positive for THC. TSH 1.017. Initial troponin negative. CT Head showed no acute intracranial abnormalities.  - he has been followed on telemetry overnight with HR in the mid-30's to 50's. No significant pauses or episodes of CHB noted. Echocardiogram is pending and scheduled to be performed later today. I have asked the patient to ambulate down the hallway later today so we can assess his HR with activity. If no significant arrhythmias noted on telemetry this admission, would consider a 30-Benjamin cardiac event monitor as an outpatient. Pending results, a GXT could be considered to assess for chronotropic incompetence but he has a history of bradycardia for 30+ years by his report and has never experienced any presyncope or syncopal episodes until yesterday.   2. Depression/ Recent Suicidal Ideation - recently started on Celexa and Klonopin. Klonopin currently held in the setting of his recent syncopal episode. Sitter at the bedside during this encounter.   For questions or updates, please contact [facility] Please consult [email or web address]  for contact info under Cardiology/STEMI.  Signed, Ellsworth Lennox, PA-C 05/06/2018, 7:45 AM Pager: [phone]  Attending note Patient seen and discussed with PA Iran Ouch, I agree with her documentation above. 53 yo male history of depression admitted with syncope  Episode occurred on his way to courthouse for a court appearance after getting out of car.   Odd pattern of intermittent lucency vs nonresponsiveness in the ER in setting of normal vital signs, see ER note, questions about possible psychosomatic symptoms.    Orthostatics negative  K 4.3 Cr 0.97 BUN 10 WBC 4.9 Hgb 15.4 Plt 257 Lactic acid 1.7 TSH 1 Trop neg UDS + THC CXR no acute process CT head no acute process Echo pending Carotid US pending EKG sinus rhythm. Other EKG shows sinus brady   Patient reports yesterday morning feeling nauseous. Episodes of diarrhea in the morning, took his newly prescribed celexa and klonopin. Got in car to go to courthouse, in car continued to feel nauseous, weak, legs felt heavy. After arriving to courthouse to get out of car had episode of syncope.   Prior EKGs show sinus brady at 52 as far back as 2012  Somewhat odd presentation of patient with syncopal episode on his way for a court appearance. Waxing and waning mentation reported in the ER in setting of normal vitals would suggest his bradycardia is not the primary issue. ER staff at leasts mention possible secondary gain/malingering based on odd behavior in ER (see there note). Clinic presentation further muddled by recently starting celexa and klonopin.   Sinus brady  to high 30s low 40s on telemetry, unclear if related to episode. There is a 1% incidence of bradycardia with celexa, recently started by patient, would consider stopping. Have patient ambulate and assess chronotropic response. Likely plan for an outpatient monitor and outpatient EP evaluation. No prior episodes of syncope.    Dina Rich MD

## 2018-05-06 NOTE — Discharge Summary (Addendum)
Physician Discharge Summary  WYAT INFINGER WUJ:[id number] DOB: Apr 06, 1966 DOA: 05/05/2018  Admit date: 05/05/2018 Discharge date: 05/06/2018  Admitted From: Home  Disposition: Home   Recommendations for Outpatient Follow-up:  1. Follow up with cardiology in 1-2 weeks for recheck 2. 30 day event monitor ordered.  3. Please follow up with [facility] in 1-2 days for recheck and medication management  Discharge Condition: STABLE   CODE STATUS: FULL    Brief Hospitalization Summary: Please see all hospital notes, images, labs for full details of the hospitalization. HPI: Benjamin Navarro is a 53 y.o. male with history of nephrolithiasis and depression was in route to an appointment with a local court house when he had a syncopal event when getting out of his car and going to the courtroom.  His family says that he has had bradycardia for all of his life.  He had been lethargic since this event and appears very drowsy, however does respond to direct questions. He had taken a klonipin earlier.  He apparently did NOT try to overdose.  His pills were counted and the numbers appeared appropriate.  He was given atropine by EMS prior to arrival secondary to a pulse rate in the mid 40s. He was also given 2.5 mg of Narcan and had improvement in his level of alertness briefly. He told family members 2 days ago that he was going to jump off of a bridge and he was taken to [facility] on 1/19 where he had been prescribed celexa and klonipin.  He had just taken the first klonipin tablet this morning prior to the syncopal event.  The RN found marijuana in his pill bottle.    ED Course:  Pt was seen in ED and noted to be bradycardic with HR 30-40 range.  His mentation has improved since being monitored in ED.  He was very unclear about his suicidal ideation status and family presented to assist with history and informed up that patient had threatened to jump off of a bridge just 2 days ago prompting his visit to [facility] on  Sunday 1/19.  The patient's EKG confirmed sinus bradycardia.  He is being admitted for further observation and management.    1. Syncope and collapse-patient had a severe syncopal episode earlier today requiring CPR and is now noted to have severe bradycardia however he reports that this is chronic.  He does have history of using marijuana.  I have asked for a urine drug screen to be done.   2D echocardiogram was ordered no significant findings - see below.   orthostatic vitals were within normal limits. 2. Symptomatic bradycardia- patient is currently reporting that he has had bradycardia for most of his life and has had no symptoms from it.  We monitored him on telemetry at this time and treated with IV fluid hydration and supportive care.  Cardiology saw him and he will be set up for a 30 day event monitor and will have outpatient cardiology follow up and consideration for outpatient referral to EP.  Pt was taken off the clonazepam and the celexa.   3. Suicidal risk- patient apparently has been having suicidal ideations I have placed him on suicide precautions and when he is medically stabilized will ask for psychiatry evaluation.  At this facility he will need TTS evaluation. 4. Chronic depression and anxiety-patient was recently started on Celexa 20 mg and Klonopin 0.5 mg.  It has been discontinued and he has been advised to follow up with [facility] for recheck  and re-evaluation.  He was seen by TTS and has been cleared for discharge.   5. Marijuana usage- He was advised to avoid all recreational drugs. 6. Severe vitamin D deficiency - He has been started on Drisdol 50,000 IU caps twice weekly.  Follow up with PCP.      DVT Prophylaxis: Lovenox Code Status: Full Family Communication: Patient Disposition Plan: Home   Echocardiogram  Study Conclusions  - Left ventricle: The cavity size was normal. Wall thickness was   normal. Systolic function was normal. The estimated ejection   fraction was  in the range of 60% to 65%. Wall motion was normal;   there were no regional wall motion abnormalities. Left   ventricular diastolic function parameters were normal. - Aortic valve: Valve area (VTI): 2.83 cm^2. Valve area (Vmax): 2.8   cm^2. - Left atrium: The atrium was moderately dilated.  Discharge Diagnoses:  Active Problems:   Syncope and collapse   Suicidal risk   Symptomatic bradycardia   Cardiopulmonary arrest with successful resuscitation (HCC)   severe Vitamin D deficiency  Discharge Instructions: Discharge Instructions    Call MD for:  difficulty breathing, headache or visual disturbances   Complete by:  As directed    Call MD for:  extreme fatigue   Complete by:  As directed    Call MD for:  persistant dizziness or light-headedness   Complete by:  As directed    Call MD for:  persistant nausea and vomiting   Complete by:  As directed    Increase activity slowly   Complete by:  As directed      Allergies as of 05/06/2018      Reactions   Penicillin G Hives   .Did it involve swelling of the face/tongue/throat, SOB, or low BP? No Did it involve sudden or severe rash/hives, skin peeling, or any reaction on the inside of your mouth or nose? Yes Did you need to seek medical attention at a hospital or doctor's office? Yes When did it last happen?2019 If all above answers are "NO", may proceed with cephalosporin use.   Iodinated Diagnostic Agents Rash      Medication List    STOP taking these medications   citalopram 20 MG tablet Commonly known as:  CELEXA   clonazePAM 0.5 MG tablet Commonly known as:  KLONOPIN   OLANZapine 5 MG tablet Commonly known as:  ZYPREXA     TAKE these medications   Vitamin D (Ergocalciferol) 1.25 MG (50000 UT) Caps capsule Commonly known as:  DRISDOL Take 1 capsule (50,000 Units total) by mouth 2 (two) times a week for 30 days. Start taking on:  May 07, 2018      Follow-up Information    Branch, Dorothe PeaJonathan F, MD.  Schedule an appointment as soon as possible for a visit in 2 week(s).   Specialty:  Cardiology Why:  Hospital Follow Up  Contact information: [address] [place] [address] [phone]        Services, [facility]. Schedule an appointment as soon as possible for a visit in 1 day(s).   Why:  Follow Up As soon As possible  Contact information: [address] [place] [address] [phone]          Allergies  Allergen Reactions  . Penicillin G Hives    .Did it involve swelling of the face/tongue/throat, SOB, or low BP? No Did it involve sudden or severe rash/hives, skin peeling, or any reaction on the inside of your  mouth or nose? Yes Did you need to seek medical attention at a hospital or doctor's office? Yes When did it last happen?2019 If all above answers are "NO", may proceed with cephalosporin use.   . Iodinated Diagnostic Agents Rash   Allergies as of 05/06/2018      Reactions   Penicillin G Hives   .Did it involve swelling of the face/tongue/throat, SOB, or low BP? No Did it involve sudden or severe rash/hives, skin peeling, or any reaction on the inside of your mouth or nose? Yes Did you need to seek medical attention at a hospital or doctor's office? Yes When did it last happen?2019 If all above answers are "NO", may proceed with cephalosporin use.   Iodinated Diagnostic Agents Rash      Medication List    STOP taking these medications   citalopram 20 MG tablet Commonly known as:  CELEXA   clonazePAM 0.5 MG tablet Commonly known as:  KLONOPIN   OLANZapine 5 MG tablet Commonly known as:  ZYPREXA     TAKE these medications   Vitamin D (Ergocalciferol) 1.25 MG (50000 UT) Caps capsule Commonly known as:  DRISDOL Take 1 capsule (50,000 Units total) by mouth 2 (two) times a week for 30 days. Start taking on:  May 07, 2018       Procedures/Studies: Ct Head Wo Contrast  Result Date: 05/05/2018 CLINICAL DATA:  Recent  syncopal episode EXAM: CT HEAD WITHOUT CONTRAST TECHNIQUE: Contiguous axial images were obtained from the base of the skull through the vertex without intravenous contrast. COMPARISON:  11/29/2017 FINDINGS: Brain: No evidence of acute infarction, hemorrhage, hydrocephalus, extra-axial collection or mass lesion/mass effect. Vascular: No hyperdense vessel or unexpected calcification. Skull: Normal. Negative for fracture or focal lesion. Sinuses/Orbits: The orbits are within normal limits. Chronic mucosal thickening in the ethmoid sinuses is noted. Other: None IMPRESSION: Chronic mucosal thickening in the ethmoid sinuses. No acute intracranial abnormality is noted. Electronically Signed   By: Alcide Clever M.D.   On: 05/05/2018 11:55   US Carotid Bilateral  Result Date: 05/06/2018 CLINICAL DATA:  Syncope, collapse EXAM: BILATERAL CAROTID DUPLEX ULTRASOUND TECHNIQUE: Wallace Cullens scale imaging, color Doppler and duplex ultrasound were performed of bilateral carotid and vertebral arteries in the neck. COMPARISON:  None. FINDINGS: Criteria: Quantification of carotid stenosis is based on velocity parameters that correlate the residual internal carotid diameter with NASCET-based stenosis levels, using the diameter of the distal internal carotid lumen as the denominator for stenosis measurement. The following velocity measurements were obtained: RIGHT ICA: 110/26 cm/sec CCA: 128/26 cm/sec SYSTOLIC ICA/CCA RATIO:  0.9 ECA: 170 cm/sec LEFT ICA: 120/33 cm/sec CCA: 137/31 cm/sec SYSTOLIC ICA/CCA RATIO:  0.9 ECA: 141 cm/sec RIGHT CAROTID ARTERY: Minor intimal thickening and early atherosclerotic plaque formation. No hemodynamically significant right ICA stenosis, velocity elevation, or turbulent flow. Degree of narrowing less than 50%. RIGHT VERTEBRAL ARTERY:  Antegrade LEFT CAROTID ARTERY: Similar minor intimal thickening and early atherosclerotic plaque formation. No hemodynamically significant left ICA stenosis, velocity elevation,  or turbulent flow. LEFT VERTEBRAL ARTERY:  Antegrade IMPRESSION: Minor carotid intimal thickening and atherosclerotic change. No hemodynamically significant ICA stenosis by ultrasound. Degree of narrowing less than 50% bilaterally by ultrasound criteria. Patent antegrade vertebral flow bilaterally Electronically Signed   By: Judie Petit.  Shick M.D.   On: 05/06/2018 13:12   Portable Chest 1 View  Result Date: 05/06/2018 CLINICAL DATA:  Recent CPR EXAM: PORTABLE CHEST 1 VIEW COMPARISON:  05/05/2018 FINDINGS: Cardiac shadow is stable. Mild aortic calcifications are seen.  The lungs are well aerated bilaterally. No focal infiltrate or effusion is seen. Stable atelectasis/scarring in the right base is seen. No definitive pneumothorax is noted. No rib abnormality is seen. IMPRESSION: No new focal abnormality noted. Electronically Signed   By: Alcide CleverMark  Lukens M.D.   On: 05/06/2018 07:25   Dg Chest Portable 1 View  Result Date: 05/05/2018 CLINICAL DATA:  Recent syncopal episode EXAM: PORTABLE CHEST 1 VIEW COMPARISON:  11/29/2017 FINDINGS: Cardiac shadows within normal limits. Aortic calcifications are seen. The lungs are well aerated bilaterally. Minimal atelectatic changes/scarring are seen in the right lung base. No acute bony abnormality is noted. IMPRESSION: Atelectasis versus scarring in the right base. No other focal abnormality is noted. Electronically Signed   By: Alcide CleverMark  Lukens M.D.   On: 05/05/2018 10:33      Subjective: Pt without complaints.  He has had no recurrence of symptoms.  HE has been ambulating with no difficulty.  [facility] and agrees to wear 30 day event monitor.    Discharge Exam: Vitals:   05/06/18 1327 05/06/18 1400  BP: 105/60   Pulse: (!) 41 (!) 51  Resp: 20   Temp: 98.4 F (36.9 C)   SpO2: 100%    Vitals:   05/05/18 1620 05/06/18 0457 05/06/18 1327 05/06/18 1400  BP: 105/63 (!) 95/54 105/60   Pulse: (!) 52 (!) 41 (!) 41 (!) 51  Resp: 17 15 20    Temp: 98.4 F  (36.9 C) 98.1 F (36.7 C) 98.4 F (36.9 C)   TempSrc: Oral  Oral   SpO2: 99% 100% 100%   Weight:      Height:        General: Pt is alert, awake, not in acute distress Cardiovascular: normal S1/S2 +, no rubs, no gallops Respiratory: CTA bilaterally, no wheezing, no rhonchi Abdominal: Soft, NT, ND, bowel sounds + Extremities: no edema, no cyanosis   The results of significant diagnostics from this hospitalization (including imaging, microbiology, ancillary and laboratory) are listed below for reference.     Microbiology: Recent Results (from the past 240 hour(s))  Blood culture (routine x 2)     Status: None (Preliminary result)   Collection Time: 05/05/18 10:24 AM  Result Value Ref Range Status   Specimen Description BLOOD LEFT HAND  Final   Special Requests   Final    BOTTLES DRAWN AEROBIC ONLY Blood Culture results may not be optimal due to an inadequate volume of blood received in culture bottles   Culture   Final    NO GROWTH < 24 HOURS Performed at [facility], [address] [place], [place] [address]    Report Status PENDING  Incomplete  Blood culture (routine x 2)     Status: None (Preliminary result)   Collection Time: 05/05/18 11:56 AM  Result Value Ref Range Status   Specimen Description BLOOD LEFT FOREARM  Final   Special Requests   Final    BOTTLES DRAWN AEROBIC AND ANAEROBIC Blood Culture results may not be optimal due to an inadequate volume of blood received in culture bottles   Culture   Final    NO GROWTH < 24 HOURS Performed at [facility], [address] [place], [place] [address]    Report Status PENDING  Incomplete     Labs: BNP (last 3 results) No results for input(s): BNP in the last 8760 hours. Basic Metabolic Panel: Recent Labs  Lab 05/05/18 1008 05/06/18 0851  NA 140 137  K  4.3 4.3  CL 106 109  CO2 24 23  GLUCOSE 90 127*  BUN 10 14  CREATININE 0.96 1.01  CALCIUM 8.8* 8.2*  MG  --  2.0   Liver Function Tests: Recent Labs   Lab 05/05/18 1008 05/06/18 0851  AST 18 15  ALT 12 11  ALKPHOS 68 47  BILITOT 0.5 0.2*  PROT 7.4 6.1*  ALBUMIN 3.8 3.1*   Recent Labs  Lab 05/05/18 1008  LIPASE 21   No results for input(s): AMMONIA in the last 168 hours. CBC: Recent Labs  Lab 05/05/18 1024 05/06/18 0851  WBC 4.9 5.2  NEUTROABS 2.6 2.5  HGB 15.4 12.3*  HCT 50.2 40.1  MCV 93.7 93.3  PLT 257 226   Cardiac Enzymes: Recent Labs  Lab 05/05/18 1008  TROPONINI <0.03   BNP: Invalid input(s): POCBNP CBG: Recent Labs  Lab 05/05/18 1124  GLUCAP 81   D-Dimer No results for input(s): DDIMER in the last 72 hours. Hgb A1c No results for input(s): HGBA1C in the last 72 hours. Lipid Profile No results for input(s): CHOL, HDL, LDLCALC, TRIG, CHOLHDL, LDLDIRECT in the last 72 hours. Thyroid function studies Recent Labs    05/05/18 1629  TSH 1.017   Anemia work up Recent Labs    05/05/18 1629  VITAMINB12 383   Urinalysis    Component Value Date/Time   COLORURINE STRAW (A) 05/05/2018 1009   APPEARANCEUR CLEAR 05/05/2018 1009   LABSPEC 1.008 05/05/2018 1009   PHURINE 8.0 05/05/2018 1009   GLUCOSEU NEGATIVE 05/05/2018 1009   HGBUR NEGATIVE 05/05/2018 1009   BILIRUBINUR NEGATIVE 05/05/2018 1009   KETONESUR NEGATIVE 05/05/2018 1009   PROTEINUR NEGATIVE 05/05/2018 1009   NITRITE NEGATIVE 05/05/2018 1009   LEUKOCYTESUR NEGATIVE 05/05/2018 1009   Sepsis Labs Invalid input(s): PROCALCITONIN,  WBC,  LACTICIDVEN Microbiology Recent Results (from the past 240 hour(s))  Blood culture (routine x 2)     Status: None (Preliminary result)   Collection Time: 05/05/18 10:24 AM  Result Value Ref Range Status   Specimen Description BLOOD LEFT HAND  Final   Special Requests   Final    BOTTLES DRAWN AEROBIC ONLY Blood Culture results may not be optimal due to an inadequate volume of blood received in culture bottles   Culture   Final    NO GROWTH < 24 HOURS Performed at [facility], [address],  [place], [place] [address]    Report Status PENDING  Incomplete  Blood culture (routine x 2)     Status: None (Preliminary result)   Collection Time: 05/05/18 11:56 AM  Result Value Ref Range Status   Specimen Description BLOOD LEFT FOREARM  Final   Special Requests   Final    BOTTLES DRAWN AEROBIC AND ANAEROBIC Blood Culture results may not be optimal due to an inadequate volume of blood received in culture bottles   Culture   Final    NO GROWTH < 24 HOURS Performed at [facility], [address] [place], [place] [address]    Report Status PENDING  Incomplete   Time coordinating discharge: 32 mins  SIGNED:  Standley Dakins, MD  [facility] 05/06/2018, 3:33 PM

## 2018-05-06 NOTE — Progress Notes (Signed)
*  PRELIMINARY RESULTS* Echocardiogram 2D Echocardiogram has been performed.  Jeryl Columbia 05/06/2018, 10:34 AM

## 2018-05-07 ENCOUNTER — Telehealth: Payer: Self-pay | Admitting: *Deleted

## 2018-05-07 DIAGNOSIS — R55 Syncope and collapse: Secondary | ICD-10-CM

## 2018-05-07 NOTE — Telephone Encounter (Signed)
-----   Message from Cleora Fleet, MD sent at 05/06/2018  3:23 PM EST ----- Regarding: 30 day event monitor Hello Benjamin Navarro,  I have a patient that needs a 30 day event monitor for syncope.  He is going to be discharged today.  Thanks for all of your help with these patients.   Maryln Manuel, MD

## 2018-05-07 NOTE — Telephone Encounter (Signed)
Order placed. Pt enrolled in [employer]

## 2018-05-10 LAB — CULTURE, BLOOD (ROUTINE X 2)
CULTURE: NO GROWTH
Culture: NO GROWTH

## 2018-05-12 ENCOUNTER — Ambulatory Visit: Payer: Self-pay | Admitting: Physician Assistant
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
## 2018-05-18 ENCOUNTER — Ambulatory Visit: Payer: Self-pay | Admitting: Physician Assistant

## 2018-05-19 ENCOUNTER — Ambulatory Visit (INDEPENDENT_AMBULATORY_CARE_PROVIDER_SITE_OTHER)

## 2018-05-19 DIAGNOSIS — R55 Syncope and collapse: Secondary | ICD-10-CM | POA: Diagnosis not present

## 2018-05-28 ENCOUNTER — Telehealth: Payer: Self-pay | Admitting: *Deleted

## 2018-05-28 NOTE — Telephone Encounter (Signed)
Received notice from [employer] stating that they have not received a transmission in 2 days. Called pt. No answer. Left msg to call back.

## 2018-06-15 ENCOUNTER — Ambulatory Visit: Payer: Self-pay | Admitting: Physician Assistant

## 2018-06-29 ENCOUNTER — Encounter: Payer: Self-pay | Admitting: Physician Assistant
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
## 2019-01-21 ENCOUNTER — Ambulatory Visit: Admitting: Physician Assistant

## 2019-01-21 ENCOUNTER — Other Ambulatory Visit: Payer: Self-pay

## 2019-01-21 ENCOUNTER — Other Ambulatory Visit: Payer: Self-pay | Admitting: Physician Assistant

## 2019-01-21 ENCOUNTER — Encounter: Payer: Self-pay | Admitting: Physician Assistant

## 2019-01-21 VITALS — BP 112/60 | HR 48 | Temp 98.1°F | Wt 134.9 lb

## 2019-01-21 DIAGNOSIS — Z1211 Encounter for screening for malignant neoplasm of colon: Secondary | ICD-10-CM

## 2019-01-21 DIAGNOSIS — Z13220 Encounter for screening for lipoid disorders: Secondary | ICD-10-CM

## 2019-01-21 DIAGNOSIS — Z125 Encounter for screening for malignant neoplasm of prostate: Secondary | ICD-10-CM

## 2019-01-21 DIAGNOSIS — Z131 Encounter for screening for diabetes mellitus: Secondary | ICD-10-CM

## 2019-01-21 DIAGNOSIS — Z7689 Persons encountering health services in other specified circumstances: Secondary | ICD-10-CM

## 2019-01-21 DIAGNOSIS — F99 Mental disorder, not otherwise specified: Secondary | ICD-10-CM

## 2019-01-21 DIAGNOSIS — R001 Bradycardia, unspecified: Secondary | ICD-10-CM

## 2019-01-21 DIAGNOSIS — F17200 Nicotine dependence, unspecified, uncomplicated: Secondary | ICD-10-CM

## 2019-01-21 DIAGNOSIS — Z862 Personal history of diseases of the blood and blood-forming organs and certain disorders involving the immune mechanism: Secondary | ICD-10-CM

## 2019-01-21 NOTE — Progress Notes (Signed)
BP 112/60   Pulse (!) 48   Temp 98.1 F (36.7 C)   Wt 134 lb 14.4 oz (61.2 kg)   SpO2 98%   BMI 20.51 kg/m    Subjective:    Patient ID: Benjamin Navarro, male    DOB: 09-Jan-1966, 53 y.o.   MRN: [id number]  HPI: Benjamin Navarro is a 53 y.o. male presenting on 01/21/2019 for No chief complaint on file.   HPI   Pt had a negative covid 19 screening questionnaire   Pt currently goes to [facility].  They give him 2 meds but he doesn't remember what they are called.    He says he feels well.    Pt walks a lot.  He denies SOB, CP while he is walking.  Marland Kitchen  He says he has no complaints.    Relevant past medical, surgical, family and social history reviewed and updated as indicated. Interim medical history since our last visit reviewed. Allergies and medications reviewed and updated.   [facility] MEDS:  Current Outpatient Medications:  .  traMADol (ULTRAM) 50 MG tablet, Take 50 mg by mouth daily., Disp: , Rfl:  .  UNKNOWN TO PATIENT, Take 1 tablet by mouth daily as needed (for insomnia)., Disp: , Rfl:     Review of Systems  Per HPI unless specifically indicated above     Objective:    BP 112/60   Pulse (!) 48   Temp 98.1 F (36.7 C)   Wt 134 lb 14.4 oz (61.2 kg)   SpO2 98%   BMI 20.51 kg/m   Wt Readings from Last 3 Encounters:  01/21/19 134 lb 14.4 oz (61.2 kg)  05/08/18 136 lb 14.4 oz (62.1 kg)  05/05/18 136 lb 14.4 oz (62.1 kg)    Physical Exam Vitals signs reviewed.  Constitutional:      General: He is not in acute distress.    Appearance: Normal appearance. He is well-developed. He is not ill-appearing.  HENT:     Head: Normocephalic and atraumatic.  Eyes:     Conjunctiva/sclera: Conjunctivae normal.     Pupils: Pupils are equal, round, and reactive to light.  Neck:     Musculoskeletal: Neck supple.     Thyroid: No thyromegaly.  Cardiovascular:     Rate and Rhythm: Normal rate and regular rhythm.  Pulmonary:     Effort: Pulmonary effort is normal.   Breath sounds: Normal breath sounds. No wheezing or rales.  Abdominal:     General: Bowel sounds are normal.     Palpations: Abdomen is soft. There is no mass.     Tenderness: There is no abdominal tenderness.  Musculoskeletal:     Right lower leg: No edema.     Left lower leg: No edema.  Lymphadenopathy:     Cervical: No cervical adenopathy.  Skin:    General: Skin is warm and dry.     Findings: No rash.  Neurological:     Mental Status: He is alert and oriented to person, place, and time.  Psychiatric:        Attention and Perception: Attention normal.        Speech: Speech normal.        Behavior: Behavior is uncooperative.     Comments: Pt repeatedly pulls mask down and has to be asked to put it back on.           Assessment & Plan:    Encounter Diagnoses  Name Primary?  . Encounter  to establish care Yes  . Bradycardia   . Tobacco use disorder   . Screening cholesterol level   . Screening for diabetes mellitus   . Screening for malignant neoplasm of prostate   . Screening for colon cancer   . History of anemia   . Mental health disorder       -will check Baseline labs -pt is given ifobt for colon cancer screening -pt to Continue with [facility] for MH issues -encouraged smoking cessation -no further cardiac testing needed for bradycardia.  Encouraged pt to avoid drugs which can worsen the bradycardia (as he has experience in the past) -pt to follow up to review labs 1 month (telehealth).  Pt to contact office sooner prn

## 2019-02-22 ENCOUNTER — Ambulatory Visit: Admitting: Physician Assistant

## 2019-09-27 IMAGING — CT CT CERVICAL SPINE W/O CM
4 of 7 series · 13 of 33 positions shown, 14 images · non-contrast
Comparison: 07/30/2016

CLINICAL DATA: Found unconscious, nonverbal, altered mental status

EXAM:
CT HEAD WITHOUT CONTRAST
CT CERVICAL SPINE WITHOUT CONTRAST
TECHNIQUE: Multidetector CT imaging of the head and cervical spine was
performed following the standard protocol without intravenous
contrast. Multiplanar CT image reconstructions of the cervical spine
were also generated.

[Series 8: c spine soft · axial · 0.39mm/px · z∈[-113,+13]mm · 4 of 106 slices shown]
[im 22/106  soft-tissue]
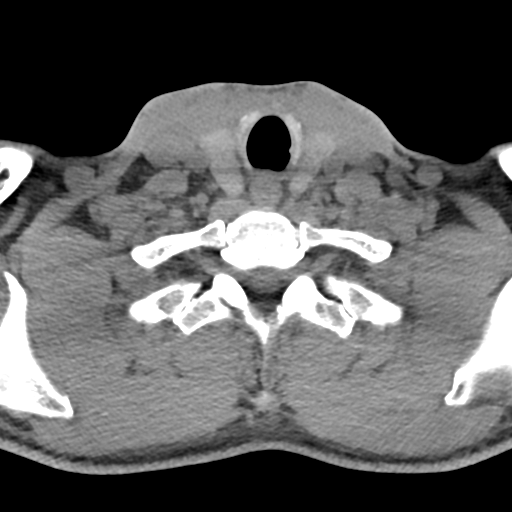
[im 43/106  soft-tissue]
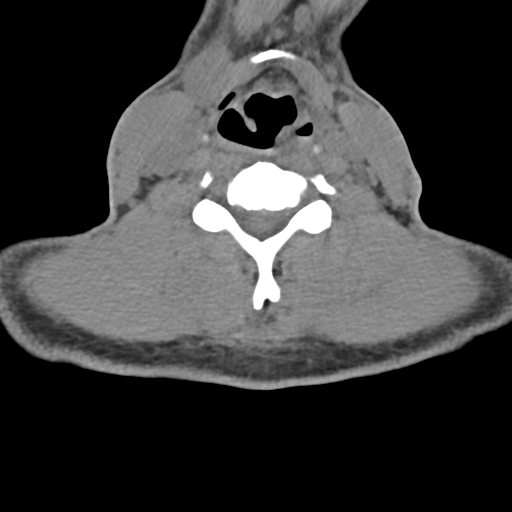
[im 64/106  soft-tissue]
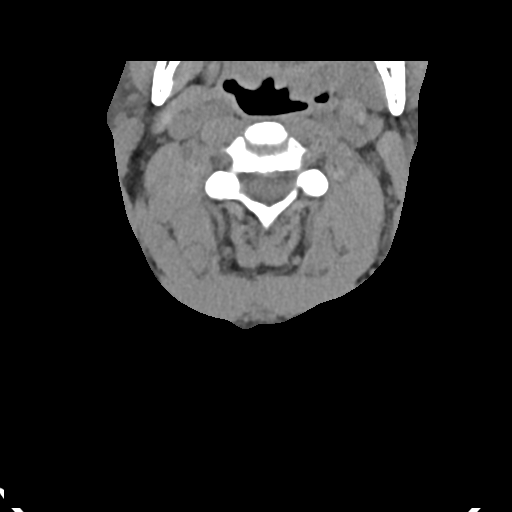
[im 85/106  soft-tissue]
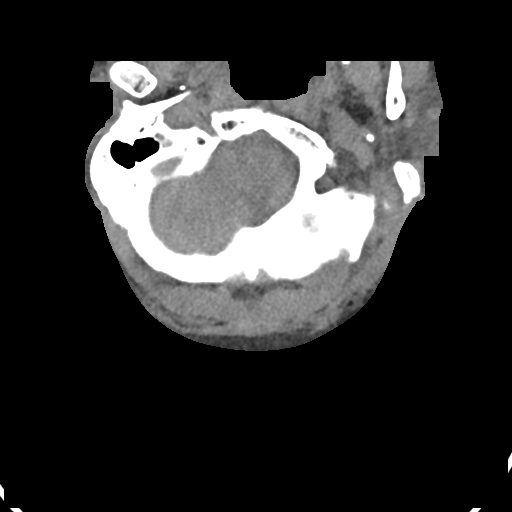

[Series 9: sagittal bone · sagittal · 0.27mm/px · 4 of 61 slices shown]
[im 13/61  bone]
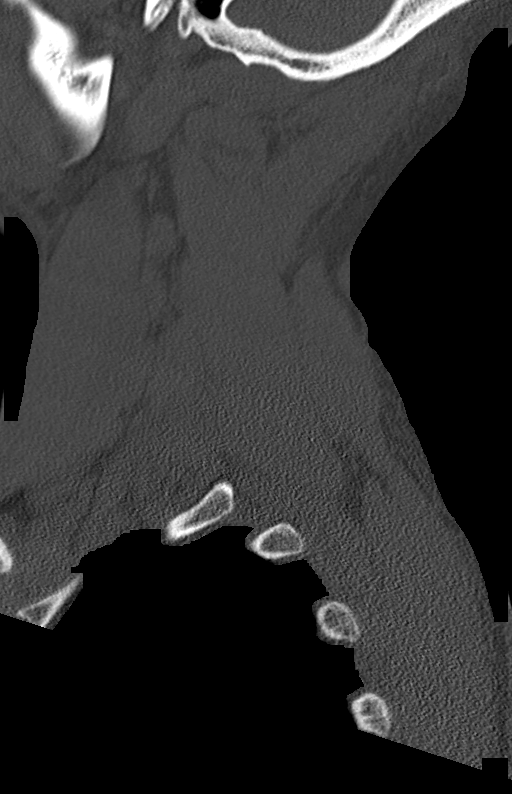
[im 25/61  bone]
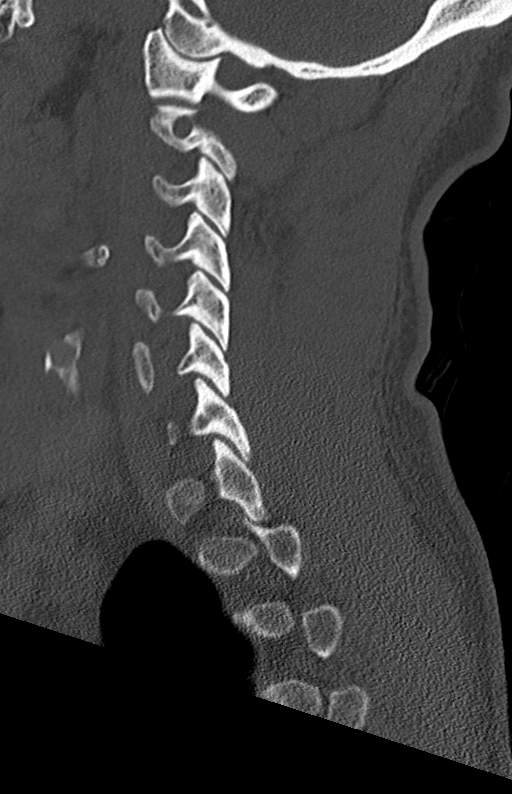
[im 37/61  bone]
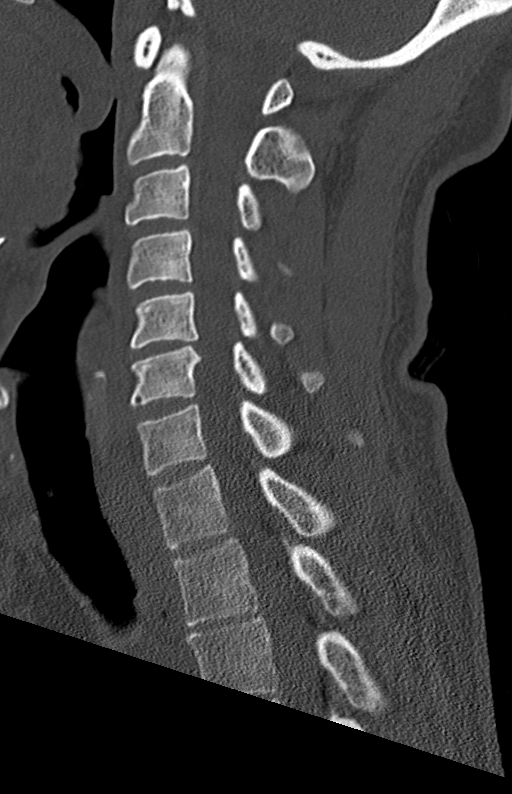
[im 49/61  bone]
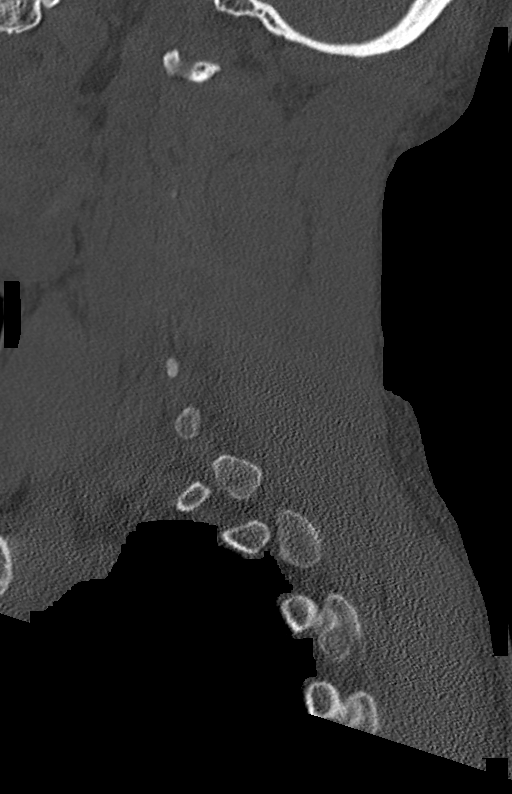

[Series 10: coronal bone · coronal · 0.31mm/px · 1 of 61 slices shown]
[im 31/61  bone]
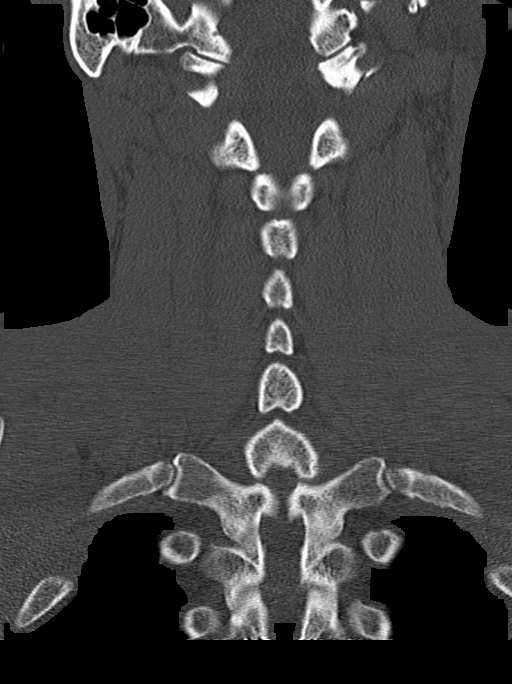

[Series 11: orthogonal bone · axial · 0.21mm/px · z∈[-142,-26]mm · 4 of 100 slices shown, 5 images]
[im 20/100  soft-tissue]
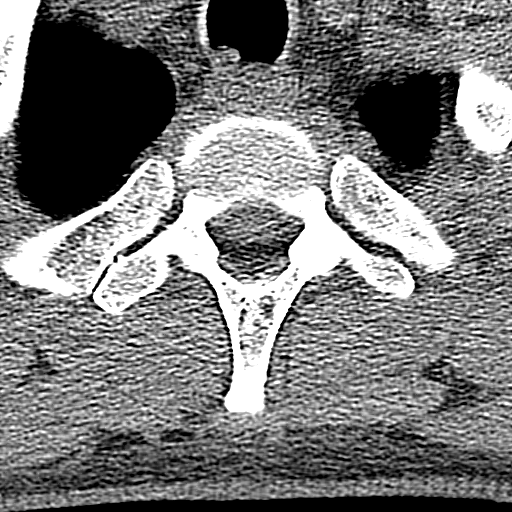
[im 20/100  bone]
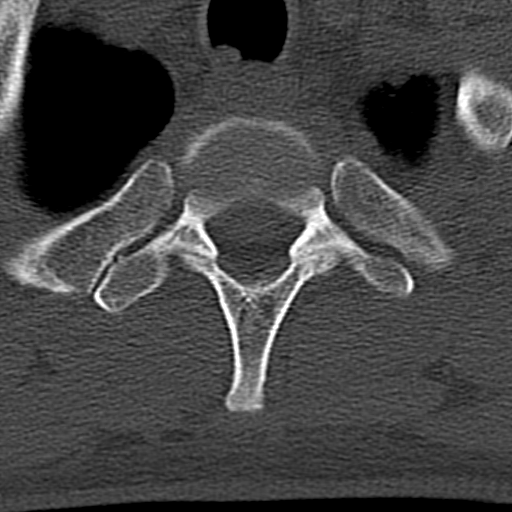
[im 40/100  bone]
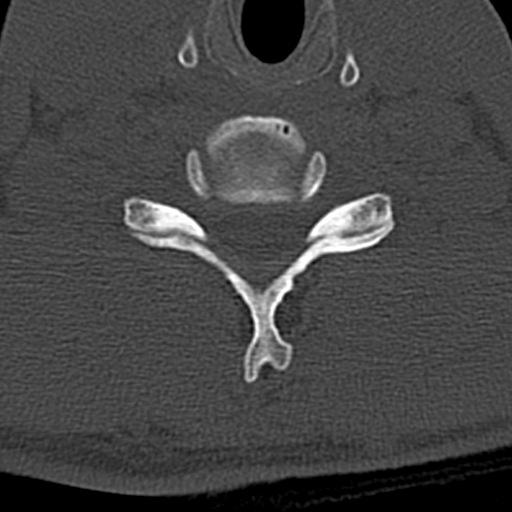
[im 60/100  bone]
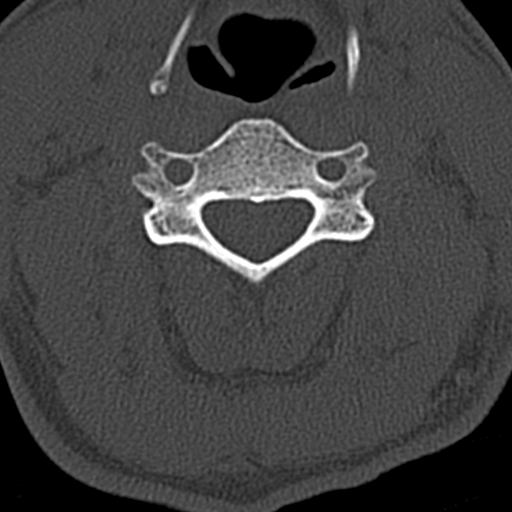
[im 80/100  bone]
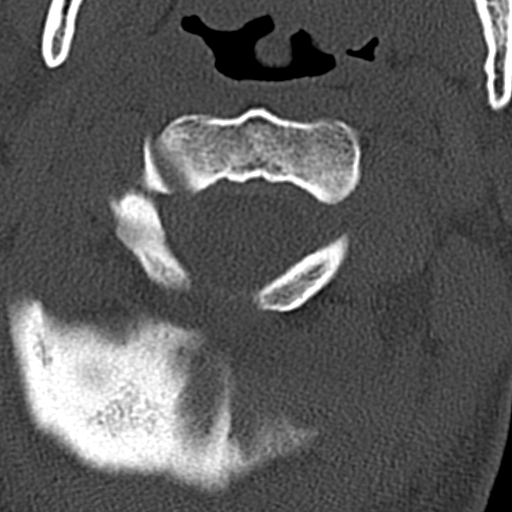

[13 of 33 positions shown; findings below may reference images not displayed]

FINDINGS: CT HEAD FINDINGS

Brain: No evidence of acute infarction, hemorrhage, hydrocephalus,
extra-axial collection or mass lesion/mass effect.

Vascular: No hyperdense vessel or unexpected calcification.

Skull: Normal. Negative for fracture or focal lesion.

Sinuses/Orbits: Minor scattered sinus mucosal thickening diffusely.
No sinus air-fluid level or complete opacification. Orbits
unremarkable.

Other: External auditory canals demonstrate cerumen impaction
bilaterally.

CT CERVICAL SPINE FINDINGS

Alignment: Normal.

Skull base and vertebrae: Incomplete fusion of the C1 arch
posteriorly, normal variant. No acute osseous finding or fracture.
Vertebral body heights maintained. No subluxation or dislocation.

Soft tissues and spinal canal: No prevertebral fluid or swelling. No
visible canal hematoma.

Disc levels: Minor endplate bony spurring at C5-6. Otherwise disc
spaces preserved.

Upper chest: Apical bullous disease noted, worse on the right.

Other: None.
IMPRESSION: Normal head CT without contrast.

Mild chronic sinus mucosal thickening

Cerumen impaction bilaterally

Minor C5-6 spondylosis without acute cervical spine fracture or
malalignment. Stable exam.

## 2019-11-16 ENCOUNTER — Other Ambulatory Visit: Payer: Self-pay

## 2019-11-16 ENCOUNTER — Emergency Department (HOSPITAL_COMMUNITY)

## 2019-11-16 ENCOUNTER — Encounter (HOSPITAL_COMMUNITY): Payer: Self-pay

## 2019-11-16 ENCOUNTER — Inpatient Hospital Stay (HOSPITAL_COMMUNITY)
Admission: EM | Admit: 2019-11-16 | Discharge: 2019-11-17 | DRG: 309 | Disposition: A | Discharge status: Home / Self Care | Attending: Family Medicine | Admitting: Family Medicine

## 2019-11-16 ENCOUNTER — Inpatient Hospital Stay (HOSPITAL_COMMUNITY)

## 2019-11-16 DIAGNOSIS — Z91041 Radiographic dye allergy status: Secondary | ICD-10-CM

## 2019-11-16 DIAGNOSIS — Z880 Allergy status to penicillin: Secondary | ICD-10-CM

## 2019-11-16 DIAGNOSIS — R9431 Abnormal electrocardiogram [ECG] [EKG]: Secondary | ICD-10-CM

## 2019-11-16 DIAGNOSIS — F17210 Nicotine dependence, cigarettes, uncomplicated: Secondary | ICD-10-CM | POA: Diagnosis present

## 2019-11-16 DIAGNOSIS — G9349 Other encephalopathy: Secondary | ICD-10-CM | POA: Diagnosis not present

## 2019-11-16 DIAGNOSIS — F329 Major depressive disorder, single episode, unspecified: Secondary | ICD-10-CM | POA: Diagnosis present

## 2019-11-16 DIAGNOSIS — R001 Bradycardia, unspecified: Principal | ICD-10-CM

## 2019-11-16 DIAGNOSIS — Z79891 Long term (current) use of opiate analgesic: Secondary | ICD-10-CM

## 2019-11-16 DIAGNOSIS — Z8249 Family history of ischemic heart disease and other diseases of the circulatory system: Secondary | ICD-10-CM

## 2019-11-16 DIAGNOSIS — Z20822 Contact with and (suspected) exposure to covid-19: Secondary | ICD-10-CM | POA: Diagnosis present

## 2019-11-16 DIAGNOSIS — G9340 Encephalopathy, unspecified: Secondary | ICD-10-CM

## 2019-11-16 DIAGNOSIS — R4020 Unspecified coma: Secondary | ICD-10-CM

## 2019-11-16 DIAGNOSIS — Z87442 Personal history of urinary calculi: Secondary | ICD-10-CM

## 2019-11-16 HISTORY — DX: Bradycardia, unspecified: R00.1

## 2019-11-16 HISTORY — DX: Suicidal ideations: R45.851

## 2019-11-16 LAB — COMPREHENSIVE METABOLIC PANEL
ALT: 15 U/L (ref 0–44)
AST: 19 U/L (ref 15–41)
Albumin: 3.6 g/dL (ref 3.5–5.0)
Alkaline Phosphatase: 57 U/L (ref 38–126)
Anion gap: 9 (ref 5–15)
BUN: 15 mg/dL (ref 6–20)
CO2: 22 mmol/L (ref 22–32)
Calcium: 8.8 mg/dL — ABNORMAL LOW (ref 8.9–10.3)
Chloride: 109 mmol/L (ref 98–111)
Creatinine, Ser: 0.95 mg/dL (ref 0.61–1.24)
GFR calc Af Amer: >60 mL/min (ref >60)
GFR calc non Af Amer: >60 mL/min (ref >60)
Glucose, Bld: 86 mg/dL (ref 70–99)
Potassium: 4 mmol/L (ref 3.5–5.1)
Sodium: 140 mmol/L (ref 135–145)
Total Bilirubin: 0.7 mg/dL (ref 0.3–1.2)
Total Protein: 6.7 g/dL (ref 6.5–8.1)

## 2019-11-16 LAB — URINALYSIS, ROUTINE W REFLEX MICROSCOPIC
Bacteria, UA: NONE SEEN
Bilirubin Urine: NEGATIVE
Glucose, UA: NEGATIVE mg/dL
Ketones, ur: 5 mg/dL — AB
Leukocytes,Ua: NEGATIVE
Nitrite: NEGATIVE
Protein, ur: NEGATIVE mg/dL
Specific Gravity, Urine: 1.025 (ref 1.005–1.030)
pH: 5 (ref 5.0–8.0)

## 2019-11-16 LAB — RAPID URINE DRUG SCREEN, HOSP PERFORMED
Amphetamines: NOT DETECTED
Barbiturates: NOT DETECTED
Benzodiazepines: NOT DETECTED
Cocaine: NOT DETECTED
Opiates: NOT DETECTED
Tetrahydrocannabinol: POSITIVE — AB

## 2019-11-16 LAB — CBC WITH DIFFERENTIAL/PLATELET
Abs Immature Granulocytes: 0.01 10*3/uL (ref 0.00–0.07)
Basophils Absolute: 0.1 10*3/uL (ref 0.0–0.1)
Basophils Relative: 1 %
Eosinophils Absolute: 0.4 10*3/uL (ref 0.0–0.5)
Eosinophils Relative: 6 %
HCT: 43.9 % (ref 39.0–52.0)
Hemoglobin: 13.8 g/dL (ref 13.0–17.0)
Immature Granulocytes: 0 %
Lymphocytes Relative: 42 %
Lymphs Abs: 2.6 10*3/uL (ref 0.7–4.0)
MCH: 29.2 pg (ref 26.0–34.0)
MCHC: 31.4 g/dL (ref 30.0–36.0)
MCV: 92.8 fL (ref 80.0–100.0)
Monocytes Absolute: 0.7 10*3/uL (ref 0.1–1.0)
Monocytes Relative: 11 %
Neutro Abs: 2.6 10*3/uL (ref 1.7–7.7)
Neutrophils Relative %: 40 %
Platelets: 232 10*3/uL (ref 150–400)
RBC: 4.73 MIL/uL (ref 4.22–5.81)
RDW: 14.3 % (ref 11.5–15.5)
WBC: 6.4 10*3/uL (ref 4.0–10.5)
nRBC: 0 % (ref 0.0–0.2)

## 2019-11-16 LAB — SARS CORONAVIRUS 2 BY RT PCR (HOSPITAL ORDER, PERFORMED IN ~~LOC~~ HOSPITAL LAB): SARS Coronavirus 2: NEGATIVE

## 2019-11-16 LAB — ECHOCARDIOGRAM COMPLETE
Area-P 1/2: 1.61 cm2
Height: 68 in
S' Lateral: 2.68 cm
Weight: 2158.74 oz

## 2019-11-16 LAB — AMMONIA: Ammonia: 20 umol/L (ref 9–35)

## 2019-11-16 LAB — TSH: TSH: 1.59 u[IU]/mL (ref 0.350–4.500)

## 2019-11-16 LAB — ETHANOL: Alcohol, Ethyl (B): <10 mg/dL (ref <10)

## 2019-11-16 LAB — TROPONIN I (HIGH SENSITIVITY)
Troponin I (High Sensitivity): 5 ng/L (ref <18)
Troponin I (High Sensitivity): 6 ng/L (ref <18)

## 2019-11-16 LAB — ACETAMINOPHEN LEVEL: Acetaminophen (Tylenol), Serum: <10 ug/mL — ABNORMAL LOW (ref 10–30)

## 2019-11-16 LAB — SALICYLATE LEVEL: Salicylate Lvl: <7 mg/dL — ABNORMAL LOW (ref 7.0–30.0)

## 2019-11-16 LAB — CK: Total CK: 430 U/L — ABNORMAL HIGH (ref 49–397)

## 2019-11-16 MED ORDER — ONDANSETRON HCL 4 MG PO TABS: 4.0000 mg | ORAL_TABLET | Freq: Four times a day (QID) | ORAL | Status: DC | PRN

## 2019-11-16 MED ORDER — ACETAMINOPHEN 650 MG RE SUPP: 650.0000 mg | Freq: Four times a day (QID) | RECTAL | Status: DC | PRN

## 2019-11-16 MED ORDER — ONDANSETRON HCL 4 MG/2ML IJ SOLN: 4.0000 mg | Freq: Four times a day (QID) | INTRAMUSCULAR | Status: DC | PRN

## 2019-11-16 MED ORDER — SODIUM CHLORIDE 0.9 % IV SOLN: 250.0000 mL | INTRAVENOUS | Status: DC | PRN

## 2019-11-16 MED ORDER — ENOXAPARIN SODIUM 40 MG/0.4ML ~~LOC~~ SOLN
40.0000 mg | SUBCUTANEOUS | Status: DC
  Administered 2019-11-16: 40 mg via SUBCUTANEOUS
  Filled 2019-11-16 (×2): qty 0.4

## 2019-11-16 MED ORDER — ACETAMINOPHEN 325 MG PO TABS: 650.0000 mg | ORAL_TABLET | Freq: Four times a day (QID) | ORAL | Status: DC | PRN

## 2019-11-16 MED ORDER — SODIUM CHLORIDE 0.9% FLUSH
3.0000 mL | Freq: Two times a day (BID) | INTRAVENOUS | Status: DC
  Administered 2019-11-17: 3 mL via INTRAVENOUS

## 2019-11-16 MED ORDER — NALOXONE HCL 2 MG/2ML IJ SOSY
1.0000 mg | PREFILLED_SYRINGE | Freq: Once | INTRAMUSCULAR | Status: AC
  Administered 2019-11-16: 1 mg via INTRAVENOUS
  Filled 2019-11-16: qty 2

## 2019-11-16 MED ORDER — SODIUM CHLORIDE 0.9% FLUSH: 3.0000 mL | INTRAVENOUS | Status: DC | PRN

## 2019-11-16 NOTE — ED Notes (Signed)
Registration aware of new billing.

## 2019-11-16 NOTE — ED Notes (Signed)
Pt speaking quickly, soft spoken. He is speaking with hospitalist.

## 2019-11-16 NOTE — Progress Notes (Signed)
*  PRELIMINARY RESULTS* Echocardiogram 2D Echocardiogram has been performed.  Jeryl Columbia 11/16/2019, 1:51 PM

## 2019-11-16 NOTE — ED Notes (Signed)
No reaction after narcan administration-Dr Rancour made aware

## 2019-11-16 NOTE — ED Notes (Signed)
Pt was given a dinner tray.  

## 2019-11-16 NOTE — ED Notes (Signed)
Pt requested drink and c/o being hungry. Nurse brought ginger ale and crackers until lunch.
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
## 2019-11-16 NOTE — ED Notes (Signed)
Pt will not talk or answer questions. Pt is not cooperative. 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

## 2019-11-16 NOTE — ED Triage Notes (Addendum)
Pt EMS from jail. Found on floor. Brought to medical. HR 97 at jail . 48 HR with ems.  AMS. FSBS 106. SB on monitor. Non-interactive with EMS  Hx of the same

## 2019-11-16 NOTE — H&P (Signed)
History and Physical    Benjamin Navarro NID:[id number] DOB: [birth year] DOA: 11/16/2019  PCP: Jacquelin Hawking, PA-C   Patient coming from: [facility]  Chief Complaint: AMS/bradycardia  HPI: Benjamin Navarro is a 54 y.o. male with medical history significant for bradycardia, depression, and prior suicidal ideations who was brought to the ED via EMS from jail after he was noted to be found on the ground, minimally responsive.  He was apparently having some shortness of breath and chest discomfort when he pushed a help button that alerted the guards.  He was apparently in the cell by himself and had only been there approximately 24 hours after he had a probation violation.  Patient is more awake and alert this morning and denies any drug use or other ingestion.  No trauma noted.  EMS had noted a blood glucose of 106 and heart rate in the 40-50 bpm range.  He was not noted to have any hypoxemia per EMS evaluation.  Patient states that he has always had lower heart rates and has been evaluated for this previously.  He is noted to be very soft-spoken and tearful this morning.  He states that he is having some mild chest discomfort, but denies any dyspnea, fevers, or chills..  No nausea, vomiting, or diaphoresis noted.  He denies palpitations.   ED Course: Vital signs otherwise stable with no fever noted.  Pulse rate 40-50 bpm noted and confirmed on EKG with LVH noted.  Chest x-ray with no acute findings.  Troponin of 5 and then 6 noted.  Ammonia level of 20.  Head CT with no acute findings noted.  He was initially given Narcan which did not appear to help his alertness.  Covid testing has returned negative.  Toxicology is positive for THC.  Blood alcohol and salicylate levels are negative.  TSH is 1.59.  Review of Systems: All others reviewed and otherwise negative except as noted above.  Past Medical History:  Diagnosis Date  . Bradycardia   . Depression   . Renal disorder    kidney stones  . Suicidal  ideations     Past Surgical History:  Procedure Laterality Date  . KNEE SURGERY Right    pt says he has pins in his knee  . LITHOTRIPSY  2000     reports that he has been smoking. He has a 7.75 pack-year smoking history. He has never used smokeless tobacco. He reports current alcohol use. He reports current drug use. Drug: Marijuana.  Allergies  Allergen Reactions  . Penicillin G Hives    .Did it involve swelling of the face/tongue/throat, SOB, or low BP? No Did it involve sudden or severe rash/hives, skin peeling, or any reaction on the inside of your mouth or nose? Yes Did you need to seek medical attention at a hospital or doctor's office? Yes When did it last happen?2019 If all above answers are "NO", may proceed with cephalosporin use.   . Iodinated Diagnostic Agents Rash    Family History  Problem Relation Age of Onset  . Hypertension Mother   . Cancer Mother   . Hypertension Father   . Cancer Father   . Hypertension Sister   . Hypertension Brother   . Hypertension Sister   . Hypertension Sister     Prior to Admission medications   Medication Sig Start Date End Date Taking? Authorizing Provider  traMADol (ULTRAM) 50 MG tablet Take 50 mg by mouth daily.    [provider]  UNKNOWN TO  PATIENT Take 1 tablet by mouth daily as needed (for insomnia).    [provider]    Physical Exam: Vitals:   11/16/19 0928 11/16/19 0943 11/16/19 0958 11/16/19 1013  BP: (!) 110/57  (!) 99/53   Pulse: (!) 43 (!) 41 (!) 42 (!) 49  Resp: (!) 25 18 17  (!) 21  Temp:      TempSrc:      SpO2: 98% 99% 99% 99%  Weight:      Height:        Constitutional: NAD, he is soft-spoken and tearful Vitals:   11/16/19 0928 11/16/19 0943 11/16/19 0958 11/16/19 1013  BP: (!) 110/57  (!) 99/53   Pulse: (!) 43 (!) 41 (!) 42 (!) 49  Resp: (!) 25 18 17  (!) 21  Temp:      TempSrc:      SpO2: 98% 99% 99% 99%  Weight:      Height:       Eyes: lids and conjunctivae  normal ENMT: Mucous membranes are moist.  Neck: normal, supple Respiratory: clear to auscultation bilaterally. Normal respiratory effort. No accessory muscle use.  Cardiovascular: Regular rate and rhythm, no murmurs.  Bradycardic.  No extremity edema. Abdomen: no tenderness, no distention. Bowel sounds positive.  Musculoskeletal:  No joint deformity upper and lower extremities.   Skin: no rashes, lesions, ulcers.  Psychiatric: Appears depressed and tearful.  Labs on Admission: I have personally reviewed following labs and imaging studies  CBC: Recent Labs  Lab 11/16/19 0423  WBC 6.4  NEUTROABS 2.6  HGB 13.8  HCT 43.9  MCV 92.8  PLT 232   Basic Metabolic Panel: Recent Labs  Lab 11/16/19 0423  NA 140  K 4.0  CL 109  CO2 22  GLUCOSE 86  BUN 15  CREATININE 0.95  CALCIUM 8.8*   GFR: Estimated Creatinine Clearance: 78.7 mL/min (by C-G formula based on SCr of 0.95 mg/dL). Liver Function Tests: Recent Labs  Lab 11/16/19 0423  AST 19  ALT 15  ALKPHOS 57  BILITOT 0.7  PROT 6.7  ALBUMIN 3.6   No results for input(s): LIPASE, AMYLASE in the last 168 hours. Recent Labs  Lab 11/16/19 0423  AMMONIA 20   Coagulation Profile: No results for input(s): INR, PROTIME in the last 168 hours. Cardiac Enzymes: Recent Labs  Lab 11/16/19 0423  CKTOTAL 430*   BNP (last 3 results) No results for input(s): PROBNP in the last 8760 hours. HbA1C: No results for input(s): HGBA1C in the last 72 hours. CBG: No results for input(s): GLUCAP in the last 168 hours. Lipid Profile: No results for input(s): CHOL, HDL, LDLCALC, TRIG, CHOLHDL, LDLDIRECT in the last 72 hours. Thyroid Function Tests: Recent Labs    11/16/19 0423  TSH 1.590   Anemia Panel: No results for input(s): VITAMINB12, FOLATE, FERRITIN, TIBC, IRON, RETICCTPCT in the last 72 hours. Urine analysis:    Component Value Date/Time   COLORURINE YELLOW 11/16/2019 0800   APPEARANCEUR CLEAR 11/16/2019 0800   LABSPEC  1.025 11/16/2019 0800   PHURINE 5.0 11/16/2019 0800   GLUCOSEU NEGATIVE 11/16/2019 0800   HGBUR SMALL (A) 11/16/2019 0800   BILIRUBINUR NEGATIVE 11/16/2019 0800   KETONESUR 5 (A) 11/16/2019 0800   PROTEINUR NEGATIVE 11/16/2019 0800   NITRITE NEGATIVE 11/16/2019 0800   LEUKOCYTESUR NEGATIVE 11/16/2019 0800    Radiological Exams on Admission: CT Head Wo Contrast  Result Date: 11/16/2019 CLINICAL DATA:  Altered mental status with delirium EXAM: CT HEAD WITHOUT CONTRAST TECHNIQUE: Contiguous  axial images were obtained from the base of the skull through the vertex without intravenous contrast. COMPARISON:  05/06/2019 FINDINGS: Brain: No evidence of acute infarction, hemorrhage, hydrocephalus, extra-axial collection or mass lesion/mass effect. Vascular: No hyperdense vessel or unexpected calcification. Skull: Normal. Negative for fracture or focal lesion. Sinuses/Orbits: No acute finding. IMPRESSION: Negative head CT. Electronically Signed   By: Marnee Spring M.D.   On: 11/16/2019 07:06   DG Chest Portable 1 View  Result Date: 11/16/2019 CLINICAL DATA:  Possible overdose. Decreased heart rate. History of bradycardia. Smoker. EXAM: PORTABLE CHEST 1 VIEW COMPARISON:  07/22/2019 FINDINGS: Shallow inspiration. Heart size and pulmonary vascularity are normal for technique. Lungs are clear. No pleural effusions. IMPRESSION: No active disease. Electronically Signed   By: Burman Nieves M.D.   On: 11/16/2019 04:22    EKG: Independently reviewed. Sinus bradycardia 46bpm. LVH.  Assessment/Plan Active Problems:   Acute encephalopathy    Transient acute encephalopathy/near syncope -Possibly related to cardiogenic cause with noted ongoing bradycardia versus psychogenic cause -Patient has not had recurrent syncope or palpitations since 04/2018 when he was last admitted -Continue to monitor on telemetry -Patient has undergone previous work-up on 04/2018 with cardiology consultation at that time and no  significant findings and it does not appear that he was compliant with the full 30-day monitoring at that time -Discussed with cardiology with no need for further evaluation at this time, could consider repeat Holter monitoring on discharge if this is feasible in jail -2D echocardiogram ordered for further evaluation, prior on 04/2018 with LVEF 60-65% and no other acute abnormalities -Previous ultrasound carotid bilateral on 04/2018 with no significant hemodynamic stenosis  Bradycardia -Questionable if this is related to the presentation -Appears to be a chronic issue -2D echo pending -Monitor on telemetry -Consult Cardiology as needed, but suspect that further workup will be unnecessary  History of depression  -Noted to have previous suicidal intent and currently denies -Does not appear to be on Celexa and Klonopin any longer -Would consider psychiatric evaluation inpatient versus outpatient if no significant findings on overnight telemetry monitoring or 2D echocardiogram as patient appears to have significant element of depression  DVT prophylaxis: Lovenox Code Status: Full code Family Communication: Cannot discuss with family according to law officer Disposition Plan:Admit for bradycardia/AMS evaluation Consults called:Discussed with Cardiology Admission status: Inpatient, tele   Lazara Grieser D Galen Malkowski DO [facility]  If 7PM-7AM, please contact night-coverage [email or web address]  11/16/2019, 2:20 PM

## 2019-11-16 NOTE — ED Provider Notes (Signed)
[facility] EMERGENCY DEPARTMENT Provider Note   CSN: [id number] Arrival date & time: 11/16/19  3825     History Chief Complaint  Patient presents with  . Altered Mental Status    Benjamin Navarro is a 54 y.o. male.  Level 5 caveat for altered mental status.  Patient minimally responsive and not giving any history.  He was found on the ground by jail staff.  He apparently pushed a help button himself complaining of shortness of breath.  When staff arrived he was found to be lying on the ground minimally responsive.  He was pointing to his chest when asked if he hurt anywhere.  He was not able to give any verbal history.  Patient in his cell by himself.  No known ingestions or drug use.  No known trauma.  He has been in jail less than 24 hours on probation violation.  EMS reports blood sugar 106, heart rate in the 40s and 50s with a history of the same in the past. [facility] staff reports that patient was not able to communicate with them at all but did point to his chest when asked if he was having any pain.  He was not found to be hypoxic or short of breath.  The history is provided by the patient, the EMS personnel and the police. The history is limited by the condition of the patient.  Altered Mental Status      Past Medical History:  Diagnosis Date  . Bradycardia   . Depression   . Renal disorder    kidney stones  . Suicidal ideations     Patient Active Problem List   Diagnosis Date Noted  . severe Vitamin D deficiency 05/06/2018  . Syncope and collapse 05/05/2018  . Suicidal risk 05/05/2018  . Symptomatic bradycardia 05/05/2018  . Cardiopulmonary arrest with successful resuscitation (HCC) 05/05/2018    Past Surgical History:  Procedure Laterality Date  . KNEE SURGERY Right    pt says he has pins in his knee  . LITHOTRIPSY  2000       Family History  Problem Relation Age of Onset  . Hypertension Mother   . Cancer Mother   . Hypertension Father   . Cancer Father     . Hypertension Sister   . Hypertension Brother   . Hypertension Sister   . Hypertension Sister     Social History   Tobacco Use  . Smoking status: Current Every Day Smoker    Packs/day: 0.25    Years: 31.00    Pack years: 7.75  . Smokeless tobacco: Never Used  Vaping Use  . Vaping Use: Never used  Substance Use Topics  . Alcohol use: Yes    Comment: occ beer on special occasions  . Drug use: Yes    Types: Marijuana    Comment: not forthcoming.  +UDS  January 2020    Home Medications Prior to Admission medications   Medication Sig Start Date End Date Taking? Authorizing Provider  traMADol (ULTRAM) 50 MG tablet Take 50 mg by mouth daily.    [provider]  UNKNOWN TO PATIENT Take 1 tablet by mouth daily as needed (for insomnia).    [provider]    Allergies    Penicillin g and Iodinated diagnostic agents  Review of Systems   Review of Systems  Unable to perform ROS: Mental status change    Physical Exam Updated Vital Signs BP (!) 147/89 (BP Location: Right Arm)   Pulse Marland Kitchen)  44   Temp 98.5 F (36.9 C) (Oral)   Resp 15   Ht 5\' 8"  (1.727 m)   Wt 61.2 kg   SpO2 98%   BMI 20.51 kg/m   Physical Exam Vitals and nursing note reviewed.  Constitutional:      General: He is not in acute distress.    Appearance: Normal appearance. He is well-developed and normal weight.     Comments: Resting with eyes closed, no signs of external trauma.  Mumbles a few words, does not answer questions.  HENT:     Head: Normocephalic and atraumatic.     Mouth/Throat:     Pharynx: No oropharyngeal exudate.  Eyes:     Conjunctiva/sclera: Conjunctivae normal.     Pupils: Pupils are equal, round, and reactive to light.     Comments: 4 mm and reactive.  Resists exam  Neck:     Comments: No meningismus. Cardiovascular:     Rate and Rhythm: Normal rate and regular rhythm.     Heart sounds: Normal heart sounds. No murmur heard.   Pulmonary:     Effort:  Pulmonary effort is normal. No respiratory distress.     Breath sounds: Normal breath sounds.  Chest:     Chest wall: No tenderness.  Abdominal:     Palpations: Abdomen is soft.     Tenderness: There is no abdominal tenderness. There is no guarding or rebound.  Musculoskeletal:        General: No tenderness. Normal range of motion.     Cervical back: Normal range of motion and neck supple.     Comments: No clonus, able to wiggle toes and squeeze fingers bilaterally  Skin:    General: Skin is warm.  Neurological:     Mental Status: He is alert.     Cranial Nerves: No cranial nerve deficit.     Motor: No abnormal muscle tone.     Coordination: Coordination normal.     Comments: Squeezes hands and wiggles toes to command.  Will not hold arms or legs off the bed.  He does protect his face from falling arm. Resists exam throughout.  Will not speak or answer questions.  Psychiatric:        Behavior: Behavior normal.     ED Results / Procedures / Treatments   Labs (all labs ordered are listed, but only abnormal results are displayed) Labs Reviewed  COMPREHENSIVE METABOLIC PANEL - Abnormal; Notable for the following components:      Result Value   Calcium 8.8 (*)    All other components within normal limits  ACETAMINOPHEN LEVEL - Abnormal; Notable for the following components:   Acetaminophen (Tylenol), Serum <10 (*)    All other components within normal limits  SALICYLATE LEVEL - Abnormal; Notable for the following components:   Salicylate Lvl <7.0 (*)    All other components within normal limits  CK - Abnormal; Notable for the following components:   Total CK 430 (*)    All other components within normal limits  SARS CORONAVIRUS 2 BY RT PCR (HOSPITAL ORDER, PERFORMED IN [facility])  CBC WITH DIFFERENTIAL/PLATELET  AMMONIA  TSH  ETHANOL  RAPID URINE DRUG SCREEN, HOSP PERFORMED  URINALYSIS, ROUTINE W REFLEX MICROSCOPIC  TROPONIN I (HIGH SENSITIVITY)  TROPONIN  I (HIGH SENSITIVITY)    EKG EKG Interpretation  Date/Time:  Tuesday November 16 2019 03:51:08 EDT Ventricular Rate:  46 PR Interval:    QRS Duration: 92 QT Interval:  432  QTC Calculation: 378 R Axis:   74 Text Interpretation: Sinus bradycardia LVH by voltage No significant change was found Confirmed by Glynn Octave [phone]) on 11/16/2019 3:58:18 AM   Radiology CT Head Wo Contrast  Result Date: 11/16/2019 CLINICAL DATA:  Altered mental status with delirium EXAM: CT HEAD WITHOUT CONTRAST TECHNIQUE: Contiguous axial images were obtained from the base of the skull through the vertex without intravenous contrast. COMPARISON:  05/06/2019 FINDINGS: Brain: No evidence of acute infarction, hemorrhage, hydrocephalus, extra-axial collection or mass lesion/mass effect. Vascular: No hyperdense vessel or unexpected calcification. Skull: Normal. Negative for fracture or focal lesion. Sinuses/Orbits: No acute finding. IMPRESSION: Negative head CT. Electronically Signed   By: Marnee Spring M.D.   On: 11/16/2019 07:06   DG Chest Portable 1 View  Result Date: 11/16/2019 CLINICAL DATA:  Possible overdose. Decreased heart rate. History of bradycardia. Smoker. EXAM: PORTABLE CHEST 1 VIEW COMPARISON:  07/22/2019 FINDINGS: Shallow inspiration. Heart size and pulmonary vascularity are normal for technique. Lungs are clear. No pleural effusions. IMPRESSION: No active disease. Electronically Signed   By: Burman Nieves M.D.   On: 11/16/2019 04:22    Procedures Procedures (including critical care time)  Medications Ordered in ED Medications  naloxone (NARCAN) injection 1 mg (has no administration in time range)    ED Course  I have reviewed the triage vital signs and the nursing notes.  Pertinent labs & imaging results that were available during my care of the patient were reviewed by me and considered in my medical decision making (see chart for details).    MDM Rules/Calculators/A&P                          Patient from jail complaining of shortness of breath and chest pain.  He is now minimally responsive and not following commands or giving history.  His EKG is sinus bradycardia which appears to be chronic.  Patient may have some volitional component to his exam as he resists exam protect his face from a falling arm. No known trauma or drug abuse.  Chart review shows admission in January 2020 with syncopal episode and bradycardia.  Patient did receive CPR by bystanders.  Found to have bradycardia and scheduled for a outpatient Holter monitor.  Work-up reassuring.  CT head is negative.  Patient becoming somewhat more awake and able to mumble a few words and follow some commands.  Does not appear to have any focal deficits.  States he remembers feeling "dizzy" and does not know what happened.  Continues to complain of some pain in his chest.  EKG is sinus bradycardia without acute ST changes.  Troponin remains negative.  Toxicology labs are reassuring.  Patient with admission in January 2020 with loss of consciousness in setting of bradycardia.  It appears the Celexa was stopped secondary to this.  He was supposed to follow-up for Holter monitor but does not appear this had occurred.  Patient mental status is improving.  His lab work is unrevealing. He remains encephalopathic but seems to be improving.  Ammonia levels normal.  Unclear circumstances that led to his loss of consciousness and possible syncope.  He has been in jail for less than 24 hours.  He may have some volitional component or psychiatric upon to his presentation. As he is not yet back to baseline will admit for further monitoring and evaluation.  Discussed with Dr. Sherryll Burger. Final Clinical Impression(s) / ED Diagnoses Final diagnoses:  Acute encephalopathy  Rx / DC Orders ED Discharge Orders    None       Nick Armel, Jeannett Senior, MD 11/16/19 [phone]

## 2019-11-17 DIAGNOSIS — R55 Syncope and collapse: Secondary | ICD-10-CM

## 2019-11-17 LAB — BASIC METABOLIC PANEL
Anion gap: 8 (ref 5–15)
BUN: 15 mg/dL (ref 6–20)
CO2: 24 mmol/L (ref 22–32)
Calcium: 8.8 mg/dL — ABNORMAL LOW (ref 8.9–10.3)
Chloride: 106 mmol/L (ref 98–111)
Creatinine, Ser: 0.95 mg/dL (ref 0.61–1.24)
GFR calc Af Amer: >60 mL/min (ref >60)
GFR calc non Af Amer: >60 mL/min (ref >60)
Glucose, Bld: 92 mg/dL (ref 70–99)
Potassium: 4.1 mmol/L (ref 3.5–5.1)
Sodium: 138 mmol/L (ref 135–145)

## 2019-11-17 LAB — MAGNESIUM: Magnesium: 1.9 mg/dL (ref 1.7–2.4)

## 2019-11-17 LAB — HIV ANTIBODY (ROUTINE TESTING W REFLEX): HIV Screen 4th Generation wRfx: NONREACTIVE

## 2019-11-17 NOTE — Discharge Summary (Signed)
Physician Discharge Summary  CHIEF WALKUP SNK:[id number] DOB: 1966/02/09 DOA: 11/16/2019  PCP: Jacquelin [facility], PA-C Cardiology: Branch  Admit date: 11/16/2019 Discharge date: 11/17/2019  Recommendations for Outpatient Follow-up:  1. Follow up with PCP in 2 weeks 2. Follow up with Dr. Wyline Mood 1 month 3. Follow up with EP cardiology as soon as scheduled  Discharge Condition: STABLE   CODE STATUS: FULL   Brief Hospitalization Summary: Please see all hospital notes, images, labs for full details of the hospitalization. ADMISSION HPI: Benjamin Navarro is a 54 y.o. male with medical history significant for bradycardia, depression, and prior suicidal ideations who was brought to the ED via EMS from jail after he was noted to be found on the ground, minimally responsive.  He was apparently having some shortness of breath and chest discomfort when he pushed a help button that alerted the guards.  He was apparently in the cell by himself and had only been there approximately 24 hours after he had a probation violation.  Patient is more awake and alert this morning and denies any drug use or other ingestion.  No trauma noted.  EMS had noted a blood glucose of 106 and heart rate in the 40-50 bpm range.  He was not noted to have any hypoxemia per EMS evaluation.  Patient states that he has always had lower heart rates and has been evaluated for this previously.  He is noted to be very soft-spoken and tearful this morning.  He states that he is having some mild chest discomfort, but denies any dyspnea, fevers, or chills..  No nausea, vomiting, or diaphoresis noted.  He denies palpitations.   ED Course: Vital signs otherwise stable with no fever noted.  Pulse rate 40-50 bpm noted and confirmed on EKG with LVH noted.  Chest x-ray with no acute findings.  Troponin of 5 and then 6 noted.  Ammonia level of 20.  Head CT with no acute findings noted.  He was initially given Narcan which did not appear to help his  alertness.  Covid testing has returned negative. Toxicology is positive for THC.  Blood alcohol and salicylate levels are negative.  TSH is 1.59.   Patient was admitted for what was thought to be a symptomatic bradycardia which is not a new finding for him.  He had been worked up in January 2020 for similar incident and he was sent home with a 30-day cardiac monitor there was only 19 days of data available on that study.  He was seen by the inpatient cardiology service again for this admission and they are now recommending Zio 14-day monitor patch for him to wear if they can make the arrangements through the prison service.  Was monitored on telemetry and he had some nocturnal bradycardia in the 30s however when he was awake his heart rate was in the 40s.  He had a 2D echocardiogram with no significant abnormalities found.  His urine drug screen was positive for marijuana.  He had normal thyroid function studies.  Dr. Wyline Mood saw him again this admission and no further inpatient work-up for cardiology has been recommended.  He is stable to discharge.  I have made a referral to EP cardiology.  I would like for him to follow-up with his PCP in 2 weeks.  Follow-up with Dr. Wyline Mood in 1 month.  Ambulatory referral to EP cardiology may prior to discharge.  The cardiology service is making arrangements for him to discharge with a monitor this.  I did  speak with present service the following at the prison services who is caring for this patient.  I updated her.  She felt like they could accommodate him wearing the monitor temporarily in the jail.   Discharge Diagnoses:  Principal Problem:   Symptomatic bradycardia   Discharge Instructions: Discharge Instructions    Ambulatory referral to Cardiac Electrophysiology   Complete by: As directed      Allergies as of 11/17/2019      Reactions   Penicillin G Hives   .Did it involve swelling of the face/tongue/throat, SOB, or low BP? No Did it involve sudden or  severe rash/hives, skin peeling, or any reaction on the inside of your mouth or nose? Yes Did you need to seek medical attention at a hospital or doctor's office? Yes When did it last happen?2019 If all above answers are "NO", may proceed with cephalosporin use.   Iodinated Diagnostic Agents Rash      Medication List    You have not been prescribed any medications.     Follow-up Information    Jacquelin HawkingMcElroy, Shannon, PA-C. Schedule an appointment as soon as possible for a visit in 2 week(s).   Specialty: Physician Assistant Contact information: [address] [place] [address] [phone]        Antoine PocheBranch, Jonathan F, MD. Schedule an appointment as soon as possible for a visit in 1 month(s).   Specialty: Cardiology Contact information: [address] [place] [address] [phone]              Allergies  Allergen Reactions  . Penicillin G Hives    .Did it involve swelling of the face/tongue/throat, SOB, or low BP? No Did it involve sudden or severe rash/hives, skin peeling, or any reaction on the inside of your mouth or nose? Yes Did you need to seek medical attention at a hospital or doctor's office? Yes When did it last happen?2019 If all above answers are "NO", may proceed with cephalosporin use.   . Iodinated Diagnostic Agents Rash   Allergies as of 11/17/2019      Reactions   Penicillin G Hives   .Did it involve swelling of the face/tongue/throat, SOB, or low BP? No Did it involve sudden or severe rash/hives, skin peeling, or any reaction on the inside of your mouth or nose? Yes Did you need to seek medical attention at a hospital or doctor's office? Yes When did it last happen?2019 If all above answers are "NO", may proceed with cephalosporin use.   Iodinated Diagnostic Agents Rash      Medication List    You have not been prescribed any medications.     Procedures/Studies: CT Head Wo Contrast  Result Date: 11/16/2019 CLINICAL  DATA:  Altered mental status with delirium EXAM: CT HEAD WITHOUT CONTRAST TECHNIQUE: Contiguous axial images were obtained from the base of the skull through the vertex without intravenous contrast. COMPARISON:  05/06/2019 FINDINGS: Brain: No evidence of acute infarction, hemorrhage, hydrocephalus, extra-axial collection or mass lesion/mass effect. Vascular: No hyperdense vessel or unexpected calcification. Skull: Normal. Negative for fracture or focal lesion. Sinuses/Orbits: No acute finding. IMPRESSION: Negative head CT. Electronically Signed   By: Marnee SpringJonathon  Watts M.D.   On: 11/16/2019 07:06   DG Chest Portable 1 View  Result Date: 11/16/2019 CLINICAL DATA:  Possible overdose. Decreased heart rate. History of bradycardia. Smoker. EXAM: PORTABLE CHEST 1 VIEW COMPARISON:  07/22/2019 FINDINGS: Shallow inspiration. Heart size and pulmonary vascularity are normal for technique. Lungs are clear. No  pleural effusions. IMPRESSION: No active disease. Electronically Signed   By: Burman Nieves M.D.   On: 11/16/2019 04:22   ECHOCARDIOGRAM COMPLETE  Result Date: 11/16/2019    ECHOCARDIOGRAM REPORT   Patient Name:   CHAPMAN MATTEUCCI Date of Exam: 11/16/2019 Medical Rec #:  [id number]      Height:       68.0 in Accession #:    [id number]     Weight:       134.9 lb Date of Birth:  02-04-1966      BSA:          1.729 m Patient Age:    54 years       BP:           99/53 mmHg Patient Gender: M              HR:           42 bpm. [facility] Procedure: 2D Echo Indications:    Abnormal ECG 794.31 / R94.31  History:        Patient has prior history of Echocardiogram examinations, most                 recent 05/06/2018. Signs/Symptoms:Syncope; Risk Factors:Current                 Smoker. Suicidal risk, Cardiopulmonary arrest with successful                 resuscitation , Bradycardia.  Sonographer:    Jeryl Columbia RDCS (AE) Referring Phys: [phone] [facility] IMPRESSIONS  1. Left ventricular ejection fraction, by  estimation, is 60 to 65%. The left ventricle has normal function. The left ventricle has no regional wall motion abnormalities. Left ventricular diastolic parameters were normal.  2. Right ventricular systolic function is normal. The right ventricular size is normal. Tricuspid regurgitation signal is inadequate for assessing PA pressure.  3. The mitral valve is grossly normal. Trivial mitral valve regurgitation.  4. The aortic valve is tricuspid. Aortic valve regurgitation is not visualized.  5. The inferior vena cava is normal in size with greater than 50% respiratory variability, suggesting right atrial pressure of 3 mmHg. FINDINGS  Left Ventricle: Left ventricular ejection fraction, by estimation, is 60 to 65%. The left ventricle has normal function. The left ventricle has no regional wall motion abnormalities. The left ventricular internal cavity size was normal in size. There is  borderline left ventricular hypertrophy. Left ventricular diastolic parameters were normal. Right Ventricle: The right ventricular size is normal. No increase in right ventricular wall thickness. Right ventricular systolic function is normal. Tricuspid regurgitation signal is inadequate for assessing PA pressure. Left Atrium: Left atrial size was normal in size. Right Atrium: Right atrial size was normal in size. Pericardium: There is no evidence of pericardial effusion. Mitral Valve: The mitral valve is grossly normal. Trivial mitral valve regurgitation. Tricuspid Valve: The tricuspid valve is grossly normal. Tricuspid valve regurgitation is trivial. Aortic Valve: The aortic valve is tricuspid. Aortic valve regurgitation is not visualized. Mild aortic valve annular calcification. Pulmonic Valve: The pulmonic valve was grossly normal. Pulmonic valve regurgitation is trivial. Aorta: The aortic root is normal in size and structure. Venous: The inferior vena cava is normal in size with greater than 50% respiratory variability, suggesting  right atrial pressure of 3 mmHg. IAS/Shunts: No atrial level shunt detected by color flow Doppler.  LEFT VENTRICLE PLAX 2D LVIDd:  4.61 cm  Diastology LVIDs:         2.68 cm  LV e' lateral:   14.00 cm/s LV PW:         1.19 cm  LV E/e' lateral: 4.2 LV IVS:        0.99 cm  LV e' medial:    10.80 cm/s LVOT diam:     1.90 cm  LV E/e' medial:  5.4 LVOT Area:     2.84 cm  RIGHT VENTRICLE RV S prime:     13.30 cm/s TAPSE (M-mode): 2.0 cm LEFT ATRIUM             Index       RIGHT ATRIUM           Index LA diam:        3.80 cm 2.20 cm/m  RA Area:     14.50 cm LA Vol (A2C):   45.4 ml 26.26 ml/m RA Volume:   43.50 ml  25.16 ml/m LA Vol (A4C):   52.0 ml 30.08 ml/m LA Biplane Vol: 53.2 ml 30.77 ml/m   AORTA Ao Root diam: 2.60 cm MITRAL VALVE MV Area (PHT): 1.61 cm    SHUNTS MV Decel Time: 472 msec    Systemic Diam: 1.90 cm MV E velocity: 58.30 cm/s MV A velocity: 38.10 cm/s MV E/A ratio:  1.53 Nona Dell MD Electronically signed by Nona Dell MD Signature Date/Time: 11/16/2019/5:27:26 PM    Final       Subjective: Patient without complaints this morning.  No chest pain or shortness of breath no further syncopal episodes.  No presyncope.  He has been eating and drinking well.  Discharge Exam: Vitals:   11/17/19 1132 11/17/19 1202  BP: 127/73 119/72  Pulse: (!) 45 (!) 52  Resp: 19   Temp:    SpO2: 99% 97%   Vitals:   11/17/19 0858 11/17/19 1102 11/17/19 1132 11/17/19 1202  BP: 120/64 (!) 141/67 127/73 119/72  Pulse: (!) 38 (!) 41 (!) 45 (!) 52  Resp: 19 17 19    Temp:      TempSrc:      SpO2: 100% 100% 99% 97%  Weight:      Height:       General: Pt is alert, awake, not in acute distress Cardiovascular: normal S1/S2 +, no rubs, no gallops Respiratory: CTA bilaterally, no wheezing, no rhonchi Abdominal: Soft, NT, ND, bowel sounds + Extremities: no edema, no cyanosis   The results of significant diagnostics from this hospitalization (including imaging, microbiology, ancillary  and laboratory) are listed below for reference.     Microbiology: Recent Results (from the past 240 hour(s))  SARS Coronavirus 2 by RT PCR (hospital order, performed in [facility] hospital lab) Nasopharyngeal Nasopharyngeal Swab     Status: None   Collection Time: 11/16/19  8:00 AM   Specimen: Nasopharyngeal Swab  Result Value Ref Range Status   SARS Coronavirus 2 NEGATIVE NEGATIVE Final    Comment: (NOTE) SARS-CoV-2 target nucleic acids are NOT DETECTED.  The SARS-CoV-2 RNA is generally detectable in upper and lower respiratory specimens during the acute phase of infection. The lowest concentration of SARS-CoV-2 viral copies this assay can detect is 250 copies / mL. A negative result does not preclude SARS-CoV-2 infection and should not be used as the sole basis for treatment or other patient management decisions.  A negative result may occur with improper specimen collection / handling, submission of specimen other than nasopharyngeal swab, presence of viral  mutation(s) within the areas targeted by this assay, and inadequate number of viral copies (<250 copies / mL). A negative result must be combined with clinical observations, patient history, and epidemiological information.  Fact Sheet for Patients:   BoilerBrush.com.cy  Fact Sheet for Healthcare Providers: https://pope.com/  This test is not yet approved or  cleared by the [place] FDA and has been authorized for detection and/or diagnosis of SARS-CoV-2 by FDA under an Emergency Use Authorization (EUA).  This EUA will remain in effect (meaning this test can be used) for the duration of the COVID-19 declaration under Section 564(b)(1) of the Act, 21 U.S.C. section 360bbb-3(b)(1), unless the authorization is terminated or revoked sooner.  Performed at [facility], [address], [place], [place] [address]      Labs: BNP (last 3 results) No results for input(s):  BNP in the last 8760 hours. Basic Metabolic Panel: Recent Labs  Lab 11/16/19 0423 11/17/19 0358  NA 140 138  K 4.0 4.1  CL 109 106  CO2 22 24  GLUCOSE 86 92  BUN 15 15  CREATININE 0.95 0.95  CALCIUM 8.8* 8.8*  MG  --  1.9   Liver Function Tests: Recent Labs  Lab 11/16/19 0423  AST 19  ALT 15  ALKPHOS 57  BILITOT 0.7  PROT 6.7  ALBUMIN 3.6   No results for input(s): LIPASE, AMYLASE in the last 168 hours. Recent Labs  Lab 11/16/19 0423  AMMONIA 20   CBC: Recent Labs  Lab 11/16/19 0423  WBC 6.4  NEUTROABS 2.6  HGB 13.8  HCT 43.9  MCV 92.8  PLT 232   Cardiac Enzymes: Recent Labs  Lab 11/16/19 0423  CKTOTAL 430*   BNP: Invalid input(s): POCBNP CBG: No results for input(s): GLUCAP in the last 168 hours. D-Dimer No results for input(s): DDIMER in the last 72 hours. Hgb A1c No results for input(s): HGBA1C in the last 72 hours. Lipid Profile No results for input(s): CHOL, HDL, LDLCALC, TRIG, CHOLHDL, LDLDIRECT in the last 72 hours. Thyroid function studies Recent Labs    11/16/19 0423  TSH 1.590   Anemia work up No results for input(s): VITAMINB12, FOLATE, FERRITIN, TIBC, IRON, RETICCTPCT in the last 72 hours. Urinalysis    Component Value Date/Time   COLORURINE YELLOW 11/16/2019 0800   APPEARANCEUR CLEAR 11/16/2019 0800   LABSPEC 1.025 11/16/2019 0800   PHURINE 5.0 11/16/2019 0800   GLUCOSEU NEGATIVE 11/16/2019 0800   HGBUR SMALL (A) 11/16/2019 0800   BILIRUBINUR NEGATIVE 11/16/2019 0800   KETONESUR 5 (A) 11/16/2019 0800   PROTEINUR NEGATIVE 11/16/2019 0800   NITRITE NEGATIVE 11/16/2019 0800   LEUKOCYTESUR NEGATIVE 11/16/2019 0800   Sepsis Labs Invalid input(s): PROCALCITONIN,  WBC,  LACTICIDVEN Microbiology Recent Results (from the past 240 hour(s))  SARS Coronavirus 2 by RT PCR (hospital order, performed in [facility] hospital lab) Nasopharyngeal Nasopharyngeal Swab     Status: None   Collection Time: 11/16/19  8:00 AM   Specimen:  Nasopharyngeal Swab  Result Value Ref Range Status   SARS Coronavirus 2 NEGATIVE NEGATIVE Final    Comment: (NOTE) SARS-CoV-2 target nucleic acids are NOT DETECTED.  The SARS-CoV-2 RNA is generally detectable in upper and lower respiratory specimens during the acute phase of infection. The lowest concentration of SARS-CoV-2 viral copies this assay can detect is 250 copies / mL. A negative result does not preclude SARS-CoV-2 infection and should not be used as the sole basis for treatment or other patient management decisions.  A  negative result may occur with improper specimen collection / handling, submission of specimen other than nasopharyngeal swab, presence of viral mutation(s) within the areas targeted by this assay, and inadequate number of viral copies (<250 copies / mL). A negative result must be combined with clinical observations, patient history, and epidemiological information.  Fact Sheet for Patients:   BoilerBrush.com.cy  Fact Sheet for Healthcare Providers: https://pope.com/  This test is not yet approved or  cleared by the [place] FDA and has been authorized for detection and/or diagnosis of SARS-CoV-2 by FDA under an Emergency Use Authorization (EUA).  This EUA will remain in effect (meaning this test can be used) for the duration of the COVID-19 declaration under Section 564(b)(1) of the Act, 21 U.S.C. section 360bbb-3(b)(1), unless the authorization is terminated or revoked sooner.  Performed at [facility], [address], [place], [place] [address]    Time coordinating discharge:   SIGNED:  Standley Dakins, MD  [facility] 11/17/2019, 3:34 PM How to contact the [facility] Attending or Consulting provider 7A - 7P or covering provider during after hours 7P -7A, for this patient?  1. Check the care team in [facility] and look for a) attending/consulting [facility] provider listed and b) the [facility] team listed 2. Log  into [email or web address] and use [facility]'s universal password to access. If you do not have the password, please contact the hospital operator. 3. Locate the [facility] provider you are looking for under [facility] and page to a number that you can be directly reached. 4. If you still have difficulty reaching the provider, please page the [facility] (Director on Call) for the Hospitalists listed on amion for assistance.

## 2019-11-17 NOTE — Discharge Instructions (Signed)
IMPORTANT INFORMATION: PAY CLOSE ATTENTION   PHYSICIAN DISCHARGE INSTRUCTIONS  Follow with Primary care provider  McElroy, Shannon, PA-C  and other consultants as instructed by your Hospitalist Physician  SEEK MEDICAL CARE OR RETURN TO EMERGENCY ROOM IF SYMPTOMS COME BACK, WORSEN OR NEW PROBLEM DEVELOPS   Please note: You were cared for by a hospitalist during your hospital stay. Every effort will be made to forward records to your primary care provider.  You can request that your primary care provider send for your hospital records if they have not received them.  Once you are discharged, your primary care physician will handle any further medical issues. Please note that NO REFILLS for any discharge medications will be authorized once you are discharged, as it is imperative that you return to your primary care physician (or establish a relationship with a primary care physician if you do not have one) for your post hospital discharge needs so that they can reassess your need for medications and monitor your lab values.  Please get a complete blood count and chemistry panel checked by your Primary MD at your next visit, and again as instructed by your Primary MD.  Get Medicines reviewed and adjusted: Please take all your medications with you for your next visit with your Primary MD  Laboratory/radiological data: Please request your Primary MD to go over all hospital tests and procedure/radiological results at the follow up, please ask your primary care provider to get all Hospital records sent to his/her office.  In some cases, they will be blood work, cultures and biopsy results pending at the time of your discharge. Please request that your primary care provider follow up on these results.  If you are diabetic, please bring your blood sugar readings with you to your follow up appointment with primary care.    Please call and make your follow up appointments as soon as possible.    Also  Note the following: If you experience worsening of your admission symptoms, develop shortness of breath, life threatening emergency, suicidal or homicidal thoughts you must seek medical attention immediately by calling 911 or calling your MD immediately  if symptoms less severe.  You must read complete instructions/literature along with all the possible adverse reactions/side effects for all the Medicines you take and that have been prescribed to you. Take any new Medicines after you have completely understood and accpet all the possible adverse reactions/side effects.   Do not drive when taking Pain medications or sleeping medications (Benzodiazepines)  Do not take more than prescribed Pain, Sleep and Anxiety Medications. It is not advisable to combine anxiety,sleep and pain medications without talking with your primary care practitioner  Special Instructions: If you have smoked or chewed Tobacco  in the last 2 yrs please stop smoking, stop any regular Alcohol  and or any Recreational drug use.  Wear Seat belts while driving.  Do not drive if taking any narcotic, mind altering or controlled substances or recreational drugs or alcohol.       

## 2019-11-17 NOTE — Consult Note (Addendum)
Cardiology Consult    Patient ID: Benjamin Navarro; [id number]; 29-Jan-1966   Admit date: 11/16/2019 Date of Consult: 11/17/2019  Primary Care Provider: Jacquelin [facility], PA-C Primary Cardiologist: No primary care provider on file.   Patient Profile    Benjamin Navarro is a 54 y.o. male with past medical history of bradycardia, depression and tobacco use who is being seen today for the evaluation of bradycardia at the request of Dr. Laural Benes.   History of Present Illness    Mr. Vanessen was last examined by the Cardiology service during an admission in 04/2018 for syncope. He reported having bradycardia since a teenager and that his heart rate was typically in the 30's to 40's.  His heart rate was variable from the 30's to 50's during his admission and a 30-day event monitor was recommended to assess for any significant arrhythmias. He did wear an outpatient monitor at that time which showed his heart rate ranged from 36 bpm to 147 bpm with an average heart rate of 65 bpm and no significant arrhythmias or pauses.  He presented to [facility] ED on 11/16/2019 after being found on the floor while at jail. Upon EMS arrival, his heart rate was found to be in the 40's to 50's and blood sugar was at 106. He was not hypoxic. By review of notes, he was not answering questions or talking at the time of arrival and was uncooperative.  Initial labs showed WBC 6.4, Hgb 13.8, platelets 232, Na+ 140, K+ 4.0 and creatinine 0.95.  Initial and repeat high-sensitivity troponin values have been negative at 5 and 6.  TSH 1.590.  Ethanol negative.  UDS positive for THC.COVID negative. CXR shows no active cardiopulmonary disease.  CT Head without acute findings. EKG shows sinus bradycardia, heart rate 46 with LVH.  By review of telemetry, he has been in sinus bradycardia with heart rate variable from the mid 30s to 50s.  He does have occasional sinus pauses with the longest being 2.41 seconds.  No evidence of high-grade AV  block.  In talking with the patient, he reports having a low heart rate since being a teenager and he has experienced over 30+ syncopal episodes during his lifetime. He reports a syncopal episode last month while at [employer] and says that he developed a tingling sensation in his hands in his mouth became dry. He lowered himself to the ground before losing consciousness. He is unsure of how long he lost consciousness for. He reports similar symptoms prior to his most recent episode which occurred while in jail. He denies any recent exertional chest pain or dyspnea on exertion. No recent orthopnea, PND or lower extremity edema. He does report occasional palpitations.  He denies any alcohol use but does consume between 5 to 6 cups of coffee on a daily basis.   Past Medical History:  Diagnosis Date  . Bradycardia   . Depression   . Renal disorder    kidney stones  . Suicidal ideations     Past Surgical History:  Procedure Laterality Date  . KNEE SURGERY Right    pt says he has pins in his knee  . LITHOTRIPSY  2000     Home Medications:  Prior to Admission medications   Not on File    Inpatient Medications: Scheduled Meds: . enoxaparin (LOVENOX) injection  40 mg Subcutaneous Q24H  . sodium chloride flush  3 mL Intravenous Q12H   Continuous Infusions: . sodium chloride  PRN Meds: sodium chloride, acetaminophen **OR** acetaminophen, ondansetron **OR** ondansetron (ZOFRAN) IV, sodium chloride flush  Allergies:    Allergies  Allergen Reactions  . Penicillin G Hives    .Did it involve swelling of the face/tongue/throat, SOB, or low BP? No Did it involve sudden or severe rash/hives, skin peeling, or any reaction on the inside of your mouth or nose? Yes Did you need to seek medical attention at a hospital or doctor's office? Yes When did it last happen?2019 If all above answers are "NO", may proceed with cephalosporin use.   . Iodinated Diagnostic Agents Rash     Social History:   Social History   Socioeconomic History  . Marital status: Single    Spouse name: Not on file  . Number of children: Not on file  . Years of education: Not on file  . Highest education level: Not on file  Occupational History  . Not on file  Tobacco Use  . Smoking status: Current Every Day Smoker    Packs/day: 0.25    Years: 31.00    Pack years: 7.75  . Smokeless tobacco: Never Used  Vaping Use  . Vaping Use: Never used  Substance and Sexual Activity  . Alcohol use: Yes    Comment: occ beer on special occasions  . Drug use: Yes    Types: Marijuana    Comment: not forthcoming.  +UDS  January 2020  . Sexual activity: Not Currently  Other Topics Concern  . Not on file  Social History Narrative   Pt lives alone in [place]   Social Determinants of Health   Financial Resource Strain:   . Difficulty of Paying Living Expenses:   Food Insecurity:   . Worried About Programme researcher, broadcasting/film/video in the Last Year:   . Barista in the Last Year:   Transportation Needs:   . Freight forwarder (Medical):   Marland Kitchen Lack of Transportation (Non-Medical):   Physical Activity:   . Days of Exercise per Week:   . Minutes of Exercise per Session:   Stress:   . Feeling of Stress :   Social Connections:   . Frequency of Communication with Friends and Family:   . Frequency of Social Gatherings with Friends and Family:   . Attends Religious Services:   . Active Member of Clubs or Organizations:   . Attends Banker Meetings:   Marland Kitchen Marital Status:   Intimate Partner Violence:   . Fear of Current or Ex-Partner:   . Emotionally Abused:   Marland Kitchen Physically Abused:   . Sexually Abused:      Family History:    Family History  Problem Relation Age of Onset  . Hypertension Mother   . Cancer Mother   . Hypertension Father   . Cancer Father   . Hypertension Sister   . Hypertension Brother   . Hypertension Sister   . Hypertension Sister       Review of Systems     General:  No chills, fever, night sweats or weight changes.  Cardiovascular:  No chest pain, dyspnea on exertion, edema, orthopnea, paroxysmal nocturnal dyspnea. Positive for palpitations and syncope.  Dermatological: No rash, lesions/masses Respiratory: No cough, dyspnea Urologic: No hematuria, dysuria Abdominal:   No nausea, vomiting, diarrhea, bright red blood per rectum, melena, or hematemesis Neurologic:  No visual changes, wkns, changes in mental status. All other systems reviewed and are otherwise negative except as noted above.  Physical Exam/Data  Vitals:   11/17/19 0602 11/17/19 0731 11/17/19 0801 11/17/19 0858  BP: 112/74 118/65 103/89 120/64  Pulse: (!) 38 (!) 40 (!) 42 (!) 38  Resp: (!) 21 20 18 19   Temp:      TempSrc:      SpO2: 98% 99% 97% 100%  Weight:      Height:       No intake or output data in the 24 hours ending 11/17/19 1020 Filed Weights   11/16/19 0339  Weight: 61.2 kg   Body mass index is 20.51 kg/m.   General: Pleasant, male appearing in NAD Psych: Normal affect. Neuro: Alert and oriented X 3. Moves all extremities spontaneously. HEENT: Normal  Neck: Supple without bruits or JVD. Lungs:  Resp regular and unlabored, CTA without wheezing or rales. Heart: Regular rhythm, bradycardiac rate. no s3, s4, or murmurs. Abdomen: Soft, non-tender, non-distended, BS + x 4.  Extremities: No clubbing, cyanosis or edema. DP/PT/Radials 2+ and equal bilaterally.   EKG:  The EKG was personally reviewed and demonstrates: Sinus bradycardia, heart rate 46 with LVH.   Labs/Studies     Relevant CV Studies:  Carotid Dopplers: 04/2018 IMPRESSION: Minor carotid intimal thickening and atherosclerotic change. No hemodynamically significant ICA stenosis by ultrasound. Degree of narrowing less than 50% bilaterally by ultrasound criteria.  Patent antegrade vertebral flow bilaterally  Event Monitor: 05/2018 30-day event recorder reviewed.  Sinus rhythm was  present throughout.  Heart rate ranged from a low of 36 bpm up to a high of 147 bpm with average heart rate 65 bpm.  There were no significant arrhythmias or pauses.  Echocardiogram: 11/16/2019 IMPRESSIONS    1. Left ventricular ejection fraction, by estimation, is 60 to 65%. The  left ventricle has normal function. The left ventricle has no regional  wall motion abnormalities. Left ventricular diastolic parameters were  normal.  2. Right ventricular systolic function is normal. The right ventricular  size is normal. Tricuspid regurgitation signal is inadequate for assessing  PA pressure.  3. The mitral valve is grossly normal. Trivial mitral valve  regurgitation.  4. The aortic valve is tricuspid. Aortic valve regurgitation is not  visualized.  5. The inferior vena cava is normal in size with greater than 50%  respiratory variability, suggesting right atrial pressure of 3 mmHg.    Laboratory Data:  Chemistry Recent Labs  Lab 11/16/19 0423 11/17/19 0358  NA 140 138  K 4.0 4.1  CL 109 106  CO2 22 24  GLUCOSE 86 92  BUN 15 15  CREATININE 0.95 0.95  CALCIUM 8.8* 8.8*  GFRNONAA >60 >60  GFRAA >60 >60  ANIONGAP 9 8    Recent Labs  Lab 11/16/19 0423  PROT 6.7  ALBUMIN 3.6  AST 19  ALT 15  ALKPHOS 57  BILITOT 0.7   Hematology Recent Labs  Lab 11/16/19 0423  WBC 6.4  RBC 4.73  HGB 13.8  HCT 43.9  MCV 92.8  MCH 29.2  MCHC 31.4  RDW 14.3  PLT 232   Cardiac EnzymesNo results for input(s): TROPONINI in the last 168 hours. No results for input(s): TROPIPOC in the last 168 hours.  BNPNo results for input(s): BNP, PROBNP in the last 168 hours.  DDimer No results for input(s): DDIMER in the last 168 hours.  Radiology/Studies:  CT Head Wo Contrast  Result Date: 11/16/2019 CLINICAL DATA:  Altered mental status with delirium EXAM: CT HEAD WITHOUT CONTRAST TECHNIQUE: Contiguous axial images were obtained from the base of the skull through  the vertex without  intravenous contrast. COMPARISON:  05/06/2019 FINDINGS: Brain: No evidence of acute infarction, hemorrhage, hydrocephalus, extra-axial collection or mass lesion/mass effect. Vascular: No hyperdense vessel or unexpected calcification. Skull: Normal. Negative for fracture or focal lesion. Sinuses/Orbits: No acute finding. IMPRESSION: Negative head CT. Electronically Signed   By: Marnee Spring M.D.   On: 11/16/2019 07:06   DG Chest Portable 1 View  Result Date: 11/16/2019 CLINICAL DATA:  Possible overdose. Decreased heart rate. History of bradycardia. Smoker. EXAM: PORTABLE CHEST 1 VIEW COMPARISON:  07/22/2019 FINDINGS: Shallow inspiration. Heart size and pulmonary vascularity are normal for technique. Lungs are clear. No pleural effusions. IMPRESSION: No active disease. Electronically Signed   By: Burman Nieves M.D.   On: 11/16/2019 04:22   ECHOCARDIOGRAM COMPLETE  Result Date: 11/16/2019    ECHOCARDIOGRAM REPORT   Patient Name:   MIA WINTHROP Date of Exam: 11/16/2019 Medical Rec #:  [id number]      Height:       68.0 in Accession #:    [id number]     Weight:       134.9 lb Date of Birth:  [birth year]      BSA:          1.729 m Patient Age:    54 years       BP:           99/53 mmHg Patient Gender: M              HR:           42 bpm. [facility] Procedure: 2D Echo Indications:    Abnormal ECG 794.31 / R94.31  History:        Patient has prior history of Echocardiogram examinations, most                 recent 05/06/2018. Signs/Symptoms:Syncope; Risk Factors:Current                 Smoker. Suicidal risk, Cardiopulmonary arrest with successful                 resuscitation , Bradycardia.  Sonographer:    Jeryl Columbia RDCS (AE) Referring Phys: [id number] [facility] IMPRESSIONS  1. Left ventricular ejection fraction, by estimation, is 60 to 65%. The left ventricle has normal function. The left ventricle has no regional wall motion abnormalities. Left ventricular diastolic parameters were normal.   2. Right ventricular systolic function is normal. The right ventricular size is normal. Tricuspid regurgitation signal is inadequate for assessing PA pressure.  3. The mitral valve is grossly normal. Trivial mitral valve regurgitation.  4. The aortic valve is tricuspid. Aortic valve regurgitation is not visualized.  5. The inferior vena cava is normal in size with greater than 50% respiratory variability, suggesting right atrial pressure of 3 mmHg. FINDINGS  Left Ventricle: Left ventricular ejection fraction, by estimation, is 60 to 65%. The left ventricle has normal function. The left ventricle has no regional wall motion abnormalities. The left ventricular internal cavity size was normal in size. There is  borderline left ventricular hypertrophy. Left ventricular diastolic parameters were normal. Right Ventricle: The right ventricular size is normal. No increase in right ventricular wall thickness. Right ventricular systolic function is normal. Tricuspid regurgitation signal is inadequate for assessing PA pressure. Left Atrium: Left atrial size was normal in size. Right Atrium: Right atrial size was normal in size. Pericardium: There is no evidence of pericardial effusion. Mitral Valve: The mitral valve  is grossly normal. Trivial mitral valve regurgitation. Tricuspid Valve: The tricuspid valve is grossly normal. Tricuspid valve regurgitation is trivial. Aortic Valve: The aortic valve is tricuspid. Aortic valve regurgitation is not visualized. Mild aortic valve annular calcification. Pulmonic Valve: The pulmonic valve was grossly normal. Pulmonic valve regurgitation is trivial. Aorta: The aortic root is normal in size and structure. Venous: The inferior vena cava is normal in size with greater than 50% respiratory variability, suggesting right atrial pressure of 3 mmHg. IAS/Shunts: No atrial level shunt detected by color flow Doppler.  LEFT VENTRICLE PLAX 2D LVIDd:         4.61 cm  Diastology LVIDs:         2.68  cm  LV e' lateral:   14.00 cm/s LV PW:         1.19 cm  LV E/e' lateral: 4.2 LV IVS:        0.99 cm  LV e' medial:    10.80 cm/s LVOT diam:     1.90 cm  LV E/e' medial:  5.4 LVOT Area:     2.84 cm  RIGHT VENTRICLE RV S prime:     13.30 cm/s TAPSE (M-mode): 2.0 cm LEFT ATRIUM             Index       RIGHT ATRIUM           Index LA diam:        3.80 cm 2.20 cm/m  RA Area:     14.50 cm LA Vol (A2C):   45.4 ml 26.26 ml/m RA Volume:   43.50 ml  25.16 ml/m LA Vol (A4C):   52.0 ml 30.08 ml/m LA Biplane Vol: 53.2 ml 30.77 ml/m   AORTA Ao Root diam: 2.60 cm MITRAL VALVE MV Area (PHT): 1.61 cm    SHUNTS MV Decel Time: 472 msec    Systemic Diam: 1.90 cm MV E velocity: 58.30 cm/s MV A velocity: 38.10 cm/s MV E/A ratio:  1.53 Nona Dell MD Electronically signed by Nona Dell MD Signature Date/Time: 11/16/2019/5:27:26 PM    Final      Assessment & Plan    1. Bradycardia/Syncope - He does report a history of syncope throughout his entire life with over 30+ episodes and also has baseline bradycardia with heart rate typically in the 30's to 40's. - Labs this admission have shown that electrolytes and TSH are within a normal range. UDS was positive for THC. His echocardiogram shows a preserved EF with no regional wall motion normalities and no significant valve abnormalities. Telemetry has shown sinus bradycardia with heart rate in the 30's to 40's and occasional sinus pauses with the longest being 2.41 seconds. - He did wear a 30-day monitor last year which showed no significant arrhythmias or pauses as outlined above. Will review with Dr. Wyline Mood but would not anticipate a repeat cardiac event monitor at this time as his episodes of syncope occur sporadically and have been spread out over several months. He would likely benefit from an EP referral as an outpatient to discuss a loop recorder (currently in jail for an estimated 10-11 days per his report). There is concern for secondary gain given his current  admission from jail but he has experienced numerous episodes in the past and his bradycardia has been consistent throughout prior admissions.  For questions or updates, please contact [facility] Please consult [email or web address] for contact info under Cardiology/STEMI.  Signed, Ellsworth Lennox, PA-C 11/17/2019, 10:20 AM Pager: [phone]  Attending note  Patient seen and discussed with PA Iran Ouch, I agree with her documentation. 54 yo male history of chronic bradycardia, tobacco abuse    Admitted Jan 2020 with syncope. He had been feeling nauseous with some diarrhea that day, just started on celexa and klonopin at that time. After getting out of car to walk to courthouse had episode of syncope. He had some bradycardia during that admission. Outpatient monitor only 19% of data available, some brady in the 30s at times but avg HR in the 60s.    WBC 6.4 Hgb 13.8 Plt 232 K 4 Cr 0.95 TSH 1.6  hstrop 5-->6] EKG sinus brady 46 UDS +THC COVID neg CXR no acute process CT head no acute process Echo LVEF 60-65%, no WMAs, normal RV function  Chronic sinus bradycardia, recurrent ongoing syncope episodes. Rates while awake 40s and higher, nocturnal rates can be down in the 30s. No high grade block or sustaned pauses. Would like to have somewhat more compelling findings prior to committing to a pacemaker. Will see if can arrange a 14 day zio patch if can be coordinated with prison. No further inpatient workup planned.   Dina Rich MD

## 2019-11-17 NOTE — Progress Notes (Signed)
11/17/2019 10:33 AM   I spoke with RN Alvino Chapel at the jail facility and she says that she believes that they could accommodate patient having a cardiac monitor if it is something that the cardiology service is recommending for him.  Will wait for cardiology recs.    Maryln Manuel MD

## 2019-11-17 NOTE — ED Notes (Signed)
Per unit secretary, pt to be discharged from ED. Pt and guard aware.

## 2019-11-17 NOTE — ED Notes (Addendum)
Guard with patient, stated that the nurse from facility had some questions/concerns about "we have been through this several times with patient." and wanted to speak with hospitalist. RN nurse contact information, [phone]. Hospitalist aware.

## 2019-11-30 ENCOUNTER — Emergency Department (HOSPITAL_COMMUNITY): Payer: Self-pay

## 2019-11-30 ENCOUNTER — Emergency Department (HOSPITAL_COMMUNITY)
Admission: EM | Admit: 2019-11-30 | Discharge: 2019-11-30 | Disposition: A | Discharge status: Home / Self Care | Payer: Self-pay | Attending: Emergency Medicine | Admitting: Emergency Medicine

## 2019-11-30 ENCOUNTER — Other Ambulatory Visit: Payer: Self-pay

## 2019-11-30 ENCOUNTER — Encounter (HOSPITAL_COMMUNITY): Payer: Self-pay

## 2019-11-30 DIAGNOSIS — F17200 Nicotine dependence, unspecified, uncomplicated: Secondary | ICD-10-CM | POA: Insufficient documentation

## 2019-11-30 DIAGNOSIS — R0789 Other chest pain: Secondary | ICD-10-CM

## 2019-11-30 DIAGNOSIS — Z79899 Other long term (current) drug therapy: Secondary | ICD-10-CM | POA: Insufficient documentation

## 2019-11-30 DIAGNOSIS — R079 Chest pain, unspecified: Secondary | ICD-10-CM

## 2019-11-30 DIAGNOSIS — F1290 Cannabis use, unspecified, uncomplicated: Secondary | ICD-10-CM | POA: Insufficient documentation

## 2019-11-30 LAB — BASIC METABOLIC PANEL
Anion gap: 7 (ref 5–15)
BUN: 16 mg/dL (ref 6–20)
CO2: 27 mmol/L (ref 22–32)
Calcium: 8.9 mg/dL (ref 8.9–10.3)
Chloride: 105 mmol/L (ref 98–111)
Creatinine, Ser: 0.99 mg/dL (ref 0.61–1.24)
GFR calc Af Amer: >60 mL/min (ref >60)
GFR calc non Af Amer: >60 mL/min (ref >60)
Glucose, Bld: 98 mg/dL (ref 70–99)
Potassium: 4.3 mmol/L (ref 3.5–5.1)
Sodium: 139 mmol/L (ref 135–145)

## 2019-11-30 LAB — CBC
HCT: 41.1 % (ref 39.0–52.0)
Hemoglobin: 12.9 g/dL — ABNORMAL LOW (ref 13.0–17.0)
MCH: 28.9 pg (ref 26.0–34.0)
MCHC: 31.4 g/dL (ref 30.0–36.0)
MCV: 92.2 fL (ref 80.0–100.0)
Platelets: 207 10*3/uL (ref 150–400)
RBC: 4.46 MIL/uL (ref 4.22–5.81)
RDW: 13.4 % (ref 11.5–15.5)
WBC: 6 10*3/uL (ref 4.0–10.5)
nRBC: 0 % (ref 0.0–0.2)

## 2019-11-30 LAB — TROPONIN I (HIGH SENSITIVITY)
Troponin I (High Sensitivity): 8 ng/L (ref <18)
Troponin I (High Sensitivity): 9 ng/L (ref <18)

## 2019-11-30 LAB — RAPID URINE DRUG SCREEN, HOSP PERFORMED
Amphetamines: NOT DETECTED
Barbiturates: NOT DETECTED
Benzodiazepines: NOT DETECTED
Cocaine: NOT DETECTED
Opiates: NOT DETECTED
Tetrahydrocannabinol: POSITIVE — AB

## 2019-11-30 MED ORDER — LIDOCAINE 5 % EX PTCH: 1.0000 | MEDICATED_PATCH | CUTANEOUS | 0 refills | Status: DC

## 2019-11-30 MED ORDER — KETOROLAC TROMETHAMINE 30 MG/ML IJ SOLN
15.0000 mg | Freq: Once | INTRAMUSCULAR | Status: AC
  Administered 2019-11-30: 15 mg via INTRAVENOUS
  Filled 2019-11-30: qty 1

## 2019-11-30 MED ORDER — DICLOFENAC SODIUM 1 % EX GEL: 4.0000 g | Freq: Four times a day (QID) | CUTANEOUS | 2 refills | Status: DC | PRN

## 2019-11-30 NOTE — ED Provider Notes (Signed)
[facility] EMERGENCY DEPARTMENT Provider Note   CSN: [id number] Arrival date & time: 11/30/19  1310     History Chief Complaint  Patient presents with  . Chest Pain    Benjamin Navarro is a 54 y.o. male.  HPI  HPI: A 54 year old patient presents for evaluation of chest pain. Initial onset of pain was more than 6 hours ago. The patient's chest pain is sharp and is not worse with exertion. The patient's chest pain is middle- or left-sided, is not well-localized, is not described as heaviness/pressure/tightness and does not radiate to the arms/jaw/neck. The patient does not complain of nausea and denies diaphoresis. The patient has smoked in the past 90 days. The patient has no history of stroke, has no history of peripheral artery disease, denies any history of treated diabetes, has no relevant family history of coronary artery disease (first degree relative at less than age 84), is not hypertensive, has no history of hypercholesterolemia and does not have an elevated BMI (>=30).    Benjamin Navarro is a 54 y.o. male, with a history of bradycardia, presenting to the ED with chest pain. He has had chest pain intermittently "for my whole life." Pain is sharp, but also describes it as a soreness, midsternal, radiating to left chest, moderate to severe. Worse with palpation.  He states this instance of chest discomfort has been present since around 10 AM this morning. Sometimes accompanied by sweating palms and mouth dryness.   He has bradycardia at baseline and states this prevents him from taking any medications for his pain.  Patient is currently incarcerated and arrives accompanied by law enforcement.  He states he has a court date in the middle of September and expects to be released at that time. Denies fever/chills, cough, syncope, shortness of breath, abdominal pain, neurologic deficits, known trauma, N/V/D, lower extremity swelling/pain, or any other complaints.   Past Medical History:    Diagnosis Date  . Bradycardia   . Depression   . Renal disorder    kidney stones  . Suicidal ideations     Patient Active Problem List   Diagnosis Date Noted  . severe Vitamin D deficiency 05/06/2018  . Syncope and collapse 05/05/2018  . Suicidal risk 05/05/2018  . Symptomatic bradycardia 05/05/2018  . Cardiopulmonary arrest with successful resuscitation (HCC) 05/05/2018    Past Surgical History:  Procedure Laterality Date  . KNEE SURGERY Right    pt says he has pins in his knee  . LITHOTRIPSY  2000       Family History  Problem Relation Age of Onset  . Hypertension Mother   . Cancer Mother   . Hypertension Father   . Cancer Father   . Hypertension Sister   . Hypertension Brother   . Hypertension Sister   . Hypertension Sister     Social History   Tobacco Use  . Smoking status: Current Every Day Smoker    Packs/day: 0.25    Years: 31.00    Pack years: 7.75  . Smokeless tobacco: Never Used  Vaping Use  . Vaping Use: Never used  Substance Use Topics  . Alcohol use: Yes    Comment: occ beer on special occasions  . Drug use: Yes    Types: Marijuana    Comment: not forthcoming.  +UDS  January 2020    Home Medications Prior to Admission medications   Medication Sig Start Date End Date Taking? Authorizing Provider  diclofenac Sodium (VOLTAREN) 1 % GEL Apply  4 g topically 4 (four) times daily as needed (Apply to painful area). 11/30/19   Janea Schwenn C, PA-C  lidocaine (LIDODERM) 5 % Place 1 patch onto the skin daily. Remove & Discard patch within 12 hours or as directed by MD 11/30/19   Harolyn Rutherford C, PA-C    Allergies    Penicillin g and Iodinated diagnostic agents  Review of Systems   Review of Systems  Constitutional: Negative for chills, diaphoresis and fever.  Respiratory: Negative for cough and shortness of breath.   Cardiovascular: Positive for chest pain. Negative for leg swelling.  Gastrointestinal: Negative for abdominal pain, diarrhea, nausea  and vomiting.  Musculoskeletal: Negative for back pain.  Neurological: Negative for syncope and weakness.  All other systems reviewed and are negative.   Physical Exam Updated Vital Signs BP 105/63 (BP Location: Left Arm)   Pulse (!) 54   Temp 98 F (36.7 C) (Oral)   Resp 16   Wt 61.2 kg   SpO2 100%   BMI 20.53 kg/m   Physical Exam Vitals and nursing note reviewed.  Constitutional:      General: He is not in acute distress.    Appearance: He is well-developed. He is not diaphoretic.  HENT:     Head: Normocephalic and atraumatic.     Mouth/Throat:     Mouth: Mucous membranes are moist.     Pharynx: Oropharynx is clear.  Eyes:     Conjunctiva/sclera: Conjunctivae normal.  Cardiovascular:     Rate and Rhythm: Normal rate and regular rhythm.     Pulses: Normal pulses.          Radial pulses are 2+ on the right side and 2+ on the left side.       Posterior tibial pulses are 2+ on the right side and 2+ on the left side.     Heart sounds: Normal heart sounds.     Comments: Tactile temperature in the extremities appropriate and equal bilaterally. Pulmonary:     Effort: Pulmonary effort is normal. No respiratory distress.     Breath sounds: Normal breath sounds.  Chest:     Chest wall: Tenderness present. No deformity, swelling or crepitus.    Abdominal:     Palpations: Abdomen is soft.     Tenderness: There is no abdominal tenderness. There is no guarding.  Musculoskeletal:     Cervical back: Neck supple.     Right lower leg: No edema.     Left lower leg: No edema.  Lymphadenopathy:     Cervical: No cervical adenopathy.  Skin:    General: Skin is warm and dry.  Neurological:     Mental Status: He is alert.  Psychiatric:        Mood and Affect: Mood and affect normal.        Speech: Speech normal.        Behavior: Behavior normal.     ED Results / Procedures / Treatments   Labs (all labs ordered are listed, but only abnormal results are displayed) Labs  Reviewed  RAPID URINE DRUG SCREEN, HOSP PERFORMED - Abnormal; Notable for the following components:      Result Value   Tetrahydrocannabinol POSITIVE (*)    All other components within normal limits  CBC - Abnormal; Notable for the following components:   Hemoglobin 12.9 (*)    All other components within normal limits  BASIC METABOLIC PANEL  TROPONIN I (HIGH SENSITIVITY)  TROPONIN I (HIGH SENSITIVITY)  EKG None   ED ECG REPORT   Date: 11/30/2019  Rate: 45  Rhythm: sinus bradycardia  QRS Axis: normal  Intervals: normal  ST/T Wave abnormalities: normal  Conduction Disutrbances:none  Narrative Interpretation:   Old EKG Reviewed: unchanged  I have personally reviewed the EKG tracing and agree with the computerized printout as noted.  Radiology DG Chest 2 View  Result Date: 11/30/2019 CLINICAL DATA:  Chest pain radiating to left thoracic cage, recent cardiac arrest and CPR, tobacco abuse EXAM: CHEST - 2 VIEW; LEFT RIBS - 2 VIEW COMPARISON:  A 321 FINDINGS: Chest: Frontal and lateral views demonstrate an unremarkable cardiac silhouette. No airspace disease, effusion, or pneumothorax. Left ribs: Frontal and oblique views of the left thoracic cage are obtained. There are no acute displaced fractures. Soft tissues are unremarkable. IMPRESSION: 1. No acute intrathoracic process. 2. No acute rib fracture. Electronically Signed   By: Sharlet Salina M.D.   On: 11/30/2019 15:47   DG Ribs Unilateral Left  Result Date: 11/30/2019 CLINICAL DATA:  Chest pain radiating to left thoracic cage, recent cardiac arrest and CPR, tobacco abuse EXAM: CHEST - 2 VIEW; LEFT RIBS - 2 VIEW COMPARISON:  A 321 FINDINGS: Chest: Frontal and lateral views demonstrate an unremarkable cardiac silhouette. No airspace disease, effusion, or pneumothorax. Left ribs: Frontal and oblique views of the left thoracic cage are obtained. There are no acute displaced fractures. Soft tissues are unremarkable. IMPRESSION: 1. No  acute intrathoracic process. 2. No acute rib fracture. Electronically Signed   By: Sharlet Salina M.D.   On: 11/30/2019 15:47        ECHOCARDIOGRAM REPORT   Patient Name:  Benjamin Navarro Date of Exam: 11/16/2019  Medical Rec #: [id number]   Height:    68.0 in  Accession #:  [id number]   Weight:    134.9 lb  Date of Birth: 03-13-1966   BSA:     1.729 m  Patient Age:  54 years    BP:      99/53 mmHg  Patient Gender: M       HR:      42 bpm.  Exam Location: [facility]   Procedure: 2D Echo   Indications:  Abnormal ECG 794.31 / R94.31    History:    Patient has prior history of Echocardiogram examinations,  most         recent 05/06/2018. Signs/Symptoms:Syncope; Risk  Factors:Current         Smoker. Suicidal risk, Cardiopulmonary arrest with  successful         resuscitation , Bradycardia.    Sonographer:  Jeryl Columbia RDCS (AE)  Referring Phys: [phone] [facility]   IMPRESSIONS    1. Left ventricular ejection fraction, by estimation, is 60 to 65%. The  left ventricle has normal function. The left ventricle has no regional  wall motion abnormalities. Left ventricular diastolic parameters were  normal.  2. Right ventricular systolic function is normal. The right ventricular  size is normal. Tricuspid regurgitation signal is inadequate for assessing  PA pressure.  3. The mitral valve is grossly normal. Trivial mitral valve  regurgitation.  4. The aortic valve is tricuspid. Aortic valve regurgitation is not  visualized.  5. The inferior vena cava is normal in size with greater than 50%  respiratory variability, suggesting right atrial pressure of 3 mmHg.   Procedures Procedures (including critical care time)  Medications Ordered in ED Medications  ketorolac (TORADOL) 30 MG/ML injection 15 mg (15 mg  Intravenous Given 11/30/19 1755)    ED Course  I have reviewed the triage vital  signs and the nursing notes.  Pertinent labs & imaging results that were available during my care of the patient were reviewed by me and considered in my medical decision making (see chart for details).    MDM Rules/Calculators/A&P HEAR Score: 2                        Patient presents with acute on chronic episode of chest pain.  Patient is nontoxic appearing, afebrile, not tachycardic, not tachypneic, not hypotensive, maintains excellent SPO2 on room air, and is in no apparent distress.  Patient is bradycardic, but has a well-documented history of this. He has tenderness to the chest and a possible consideration is that he has pain from the CPR but that was performed on him when he had a syncopal episode earlier this month.  I have reviewed the patient's chart to obtain more information.   I reviewed and interpreted the patient's labs and radiological studies.  No acute abnormalities on patient's chest or rib x-rays. Low suspicion for ACS. EKG without evidence of acute ischemia or pathologic/symptomatic arrhythmia.  Delta troponins negative. Wells criteria score is 0, indicating low risk for PE.   Dissection was considered, but thought less likely base on: History and description of the pain are not suggestive, patient is not ill-appearing, lack of risk factors, equal bilateral pulses, lack of neurologic deficits, no widened mediastinum on chest x-ray.  Patient's pain improved following administration of Toradol.   Patient was seen in the ED on August 3 due to altered level consciousness.  He was evaluated by cardiology during that admission.  Echo results were overall unremarkable.  Dr. Wyline MoodBranch evaluated the patient and states his work-up so far is reassuring.  It was recommended that he follow-up with Dr. Verna CzechBranch's office in about a month (so around the beginning of September).  This recommendation was reiterated to the patient.  The patient was given instructions for home care as well as  return precautions. Patient voices understanding of these instructions, accepts the plan, and is comfortable with discharge.      Vitals:   11/30/19 1400 11/30/19 1403 11/30/19 1701 11/30/19 1729  BP: 105/63  120/63 111/64  Pulse: (!) 54  (!) 43 (!) 43  Resp: 16  (!) 22 15  Temp: 98 F (36.7 C)     TempSrc: Oral     SpO2: 100%  100% 100%  Weight:  61.2 kg       Final Clinical Impression(s) / ED Diagnoses Final diagnoses:  Atypical chest pain    Rx / DC Orders ED Discharge Orders         Ordered    diclofenac Sodium (VOLTAREN) 1 % GEL  4 times daily PRN     Discontinue  Reprint     11/30/19 1903    lidocaine (LIDODERM) 5 %  Every 24 hours     Discontinue  Reprint     11/30/19 1903           Concepcion LivingJoy, Alixander Rallis C, PA-C 11/30/19 1914    Maia PlanLong, Joshua G, MD 12/06/19 1112

## 2019-11-30 NOTE — ED Triage Notes (Signed)
Pt via [facility] from [facility] accompanied by Deputy. Pt complains of ongoing chest pain for several years. EMS advised unsuccessful IV attempt. 12 lead showed sinus bradycardia. Denies SOB.

## 2019-11-30 NOTE — Discharge Instructions (Addendum)
Work-up today was overall reassuring.  Please follow-up with cardiology on this matter.  Antiinflammatory medications: Take 600 mg of ibuprofen every 6 hours or 440 mg (over the counter dose) to 500 mg (prescription dose) of naproxen every 12 hours for the next 3 days. After this time, these medications may be used as needed for pain. Take these medications with food to avoid upset stomach. Choose only one of these medications, do not take them together. Acetaminophen (generic for Tylenol): Should you continue to have additional pain while taking the ibuprofen or naproxen, you may add in acetaminophen as needed. Your daily total maximum amount of acetaminophen from all sources should be limited to 4000mg /day for persons without liver problems, or 2000mg /day for those with liver problems.  Diclofenac gel: This is a topical anti-inflammatory medication and can be applied directly to the painful region.  Do not use on the face or genitals.  This medication may be used as an alternative to oral anti-inflammatory medications, such as ibuprofen or naproxen.  Lidocaine patches: These are available via either prescription or over-the-counter. The over-the-counter option may be more economical one and are likely just as effective. There are multiple over-the-counter brands, such as Salonpas.

## 2019-12-07 ENCOUNTER — Ambulatory Visit: Payer: Self-pay | Admitting: Internal Medicine

## 2019-12-15 ENCOUNTER — Other Ambulatory Visit: Payer: Self-pay

## 2019-12-15 ENCOUNTER — Ambulatory Visit (INDEPENDENT_AMBULATORY_CARE_PROVIDER_SITE_OTHER): Payer: Self-pay

## 2019-12-15 ENCOUNTER — Other Ambulatory Visit: Payer: Self-pay | Admitting: *Deleted

## 2019-12-15 DIAGNOSIS — R001 Bradycardia, unspecified: Secondary | ICD-10-CM

## 2019-12-28 ENCOUNTER — Ambulatory Visit: Payer: Self-pay | Admitting: Internal Medicine

## 2019-12-31 ENCOUNTER — Encounter: Payer: Self-pay | Admitting: Internal Medicine

## 2020-02-02 ENCOUNTER — Institutional Professional Consult (permissible substitution): Payer: Self-pay | Admitting: Internal Medicine

## 2020-02-09 ENCOUNTER — Encounter: Payer: Self-pay | Admitting: Internal Medicine

## 2022-06-16 ENCOUNTER — Encounter (HOSPITAL_COMMUNITY): Payer: Self-pay

## 2022-06-16 ENCOUNTER — Other Ambulatory Visit: Payer: Self-pay

## 2022-06-16 ENCOUNTER — Emergency Department (HOSPITAL_COMMUNITY)

## 2022-06-16 ENCOUNTER — Emergency Department (HOSPITAL_COMMUNITY)
Admission: EM | Admit: 2022-06-16 | Discharge: 2022-06-16 | Attending: Emergency Medicine | Admitting: Emergency Medicine

## 2022-06-16 DIAGNOSIS — S0093XA Contusion of unspecified part of head, initial encounter: Secondary | ICD-10-CM | POA: Insufficient documentation

## 2022-06-16 DIAGNOSIS — X58XXXA Exposure to other specified factors, initial encounter: Secondary | ICD-10-CM | POA: Insufficient documentation

## 2022-06-16 DIAGNOSIS — M542 Cervicalgia: Secondary | ICD-10-CM | POA: Insufficient documentation

## 2022-06-16 DIAGNOSIS — R519 Headache, unspecified: Secondary | ICD-10-CM | POA: Diagnosis present

## 2022-06-16 DIAGNOSIS — R55 Syncope and collapse: Secondary | ICD-10-CM | POA: Diagnosis not present

## 2022-06-16 DIAGNOSIS — R001 Bradycardia, unspecified: Secondary | ICD-10-CM | POA: Diagnosis not present

## 2022-06-16 DIAGNOSIS — Y900 Blood alcohol level of less than 20 mg/100 ml: Secondary | ICD-10-CM | POA: Diagnosis not present

## 2022-06-16 LAB — CBC WITH DIFFERENTIAL/PLATELET
Abs Immature Granulocytes: 0 10*3/uL (ref 0.00–0.07)
Basophils Absolute: 0.1 10*3/uL (ref 0.0–0.1)
Basophils Relative: 1 %
Eosinophils Absolute: 0.6 10*3/uL — ABNORMAL HIGH (ref 0.0–0.5)
Eosinophils Relative: 8 %
HCT: 42.3 % (ref 39.0–52.0)
Hemoglobin: 13.6 g/dL (ref 13.0–17.0)
Immature Granulocytes: 0 %
Lymphocytes Relative: 41 %
Lymphs Abs: 3 10*3/uL (ref 0.7–4.0)
MCH: 29.2 pg (ref 26.0–34.0)
MCHC: 32.2 g/dL (ref 30.0–36.0)
MCV: 90.8 fL (ref 80.0–100.0)
Monocytes Absolute: 0.9 10*3/uL (ref 0.1–1.0)
Monocytes Relative: 12 %
Neutro Abs: 2.8 10*3/uL (ref 1.7–7.7)
Neutrophils Relative %: 38 %
Platelets: 242 10*3/uL (ref 150–400)
RBC: 4.66 MIL/uL (ref 4.22–5.81)
RDW: 14.5 % (ref 11.5–15.5)
WBC: 7.4 10*3/uL (ref 4.0–10.5)
nRBC: 0 % (ref 0.0–0.2)

## 2022-06-16 LAB — COMPREHENSIVE METABOLIC PANEL
ALT: 37 U/L (ref 0–44)
AST: 25 U/L (ref 15–41)
Albumin: 3.4 g/dL — ABNORMAL LOW (ref 3.5–5.0)
Alkaline Phosphatase: 81 U/L (ref 38–126)
Anion gap: 6 (ref 5–15)
BUN: 18 mg/dL (ref 6–20)
CO2: 27 mmol/L (ref 22–32)
Calcium: 8.5 mg/dL — ABNORMAL LOW (ref 8.9–10.3)
Chloride: 103 mmol/L (ref 98–111)
Creatinine, Ser: 1 mg/dL (ref 0.61–1.24)
GFR, Estimated: >60 mL/min (ref >60)
Glucose, Bld: 92 mg/dL (ref 70–99)
Potassium: 3.8 mmol/L (ref 3.5–5.1)
Sodium: 136 mmol/L (ref 135–145)
Total Bilirubin: 0.4 mg/dL (ref 0.3–1.2)
Total Protein: 6.9 g/dL (ref 6.5–8.1)

## 2022-06-16 LAB — ETHANOL: Alcohol, Ethyl (B): <10 mg/dL (ref <10)

## 2022-06-16 LAB — TROPONIN I (HIGH SENSITIVITY): Troponin I (High Sensitivity): 8 ng/L (ref <18)

## 2022-06-16 MED ORDER — ACETAMINOPHEN 325 MG PO TABS
650.0000 mg | ORAL_TABLET | Freq: Once | ORAL | Status: AC
  Administered 2022-06-16: 650 mg via ORAL
  Filled 2022-06-16: qty 2

## 2022-06-16 NOTE — ED Triage Notes (Signed)
[facility], EMS called out for unresponsive, upon arrival pt was lying in floor beside his mattress, pt was breathing and had pulse, but did not respond to sternal rub, pt turned head away from ammonia inhalent, when pt got in EMS, pt started answering questions. Pt says he does not know what happened. EMS report they asked pt what happened and pt reported he did not know what happened. Pt alert and oriented x 4 at this time. Pt c/o neck pain and headache.

## 2022-06-16 NOTE — ED Provider Notes (Signed)
[facility] Provider Note   CSN: [id number] Arrival date & time: 06/16/22  0126     History  Chief Complaint  Patient presents with   Loss of Consciousness    Benjamin Navarro is a 57 y.o. male.  Patient presents to the emergency department for evaluation after a syncopal event.  Patient reports that he got up to go to the bathroom when this occurred.  He does not exactly remember passing out but woke up on the floor next to his bed.  Patient complaining of headache and neck pain.  He reports that he has had a low heart rate for many years and this has caused him to have syncope multiple times in the past.  He did not have any chest pain, heart palpitations, shortness of breath.  Patient denies any other injuries.       Home Medications Prior to Admission medications   Medication Sig Start Date End Date Taking? Authorizing Provider  diclofenac Sodium (VOLTAREN) 1 % GEL Apply 4 g topically 4 (four) times daily as needed (Apply to painful area). 11/30/19   Joy, Shawn C, PA-C  lidocaine (LIDODERM) 5 % Place 1 patch onto the skin daily. Remove & Discard patch within 12 hours or as directed by MD 11/30/19   Arlean Hopping C, PA-C      Allergies    Penicillin g and Iodinated contrast media    Review of Systems   Review of Systems  Physical Exam Updated Vital Signs BP (!) 168/90   Pulse (!) 46   Temp 97.8 F (36.6 C) (Oral)   Resp 14   Ht '5\' 8"'$  (1.727 m)   Wt 68 kg   SpO2 100%   BMI 22.81 kg/m  Physical Exam Vitals and nursing note reviewed.  Constitutional:      General: He is not in acute distress.    Appearance: He is well-developed.  HENT:     Head: Normocephalic. Contusion present.     Mouth/Throat:     Mouth: Mucous membranes are moist.  Eyes:     General: Vision grossly intact. Gaze aligned appropriately.     Extraocular Movements: Extraocular movements intact.     Conjunctiva/sclera: Conjunctivae normal.   Cardiovascular:     Rate and Rhythm: Normal rate and regular rhythm.     Pulses: Normal pulses.     Heart sounds: Normal heart sounds, S1 normal and S2 normal. No murmur heard.    No friction rub. No gallop.  Pulmonary:     Effort: Pulmonary effort is normal. No respiratory distress.     Breath sounds: Normal breath sounds.  Abdominal:     Palpations: Abdomen is soft.     Tenderness: There is no abdominal tenderness. There is no guarding or rebound.     Hernia: No hernia is present.  Musculoskeletal:        General: No swelling.     Cervical back: Full passive range of motion without pain, normal range of motion and neck supple. Muscular tenderness present. No pain with movement or spinous process tenderness. Normal range of motion.     Right lower leg: No edema.     Left lower leg: No edema.  Skin:    General: Skin is warm and dry.     Capillary Refill: Capillary refill takes less than 2 seconds.     Findings: No ecchymosis, erythema, lesion or wound.  Neurological:     Mental Status: He  is alert and oriented to person, place, and time.     GCS: GCS eye subscore is 4. GCS verbal subscore is 5. GCS motor subscore is 6.     Cranial Nerves: Cranial nerves 2-12 are intact.     Sensory: Sensation is intact.     Motor: Motor function is intact. No weakness or abnormal muscle tone.     Coordination: Coordination is intact.  Psychiatric:        Mood and Affect: Mood normal.        Speech: Speech normal.        Behavior: Behavior normal.     ED Results / Procedures / Treatments   Labs (all labs ordered are listed, but only abnormal results are displayed) Labs Reviewed  CBC WITH DIFFERENTIAL/PLATELET - Abnormal; Notable for the following components:      Result Value   Eosinophils Absolute 0.6 (*)    All other components within normal limits  COMPREHENSIVE METABOLIC PANEL - Abnormal; Notable for the following components:   Calcium 8.5 (*)    Albumin 3.4 (*)    All other  components within normal limits  ETHANOL  RAPID URINE DRUG SCREEN, HOSP PERFORMED  URINALYSIS, ROUTINE W REFLEX MICROSCOPIC  TROPONIN I (HIGH SENSITIVITY)    EKG EKG Interpretation  Date/Time:  Sunday June 16 2022 01:35:38 EST Ventricular Rate:  46 PR Interval:  175 QRS Duration: 97 QT Interval:  448 QTC Calculation: 392 R Axis:   62 Text Interpretation: Sinus bradycardia Abnormal R-wave progression, early transition Minimal ST elevation, anterior leads No significant change since last tracing Confirmed by Orpah Greek [phone]) on 06/16/2022 1:37:59 AM  Radiology CT CERVICAL SPINE WO CONTRAST  Result Date: 06/16/2022 CLINICAL DATA:  Neck trauma, dangerous injury mechanism (Age 17-64y). Unresponsive. EXAM: CT CERVICAL SPINE WITHOUT CONTRAST TECHNIQUE: Multidetector CT imaging of the cervical spine was performed without intravenous contrast. Multiplanar CT image reconstructions were also generated. RADIATION DOSE REDUCTION: This exam was performed according to the departmental dose-optimization program which includes automated exposure control, adjustment of the mA and/or kV according to patient size and/or use of iterative reconstruction technique. COMPARISON:  11/29/2017 FINDINGS: Alignment: Normal Skull base and vertebrae: No acute fracture. No primary bone lesion or focal pathologic process. Soft tissues and spinal canal: No prevertebral fluid or swelling. No visible canal hematoma. Disc levels: Disc space narrowing at C5-6 with mild spurring. No disc herniation. Upper chest: Emphysematous changes in the apices. Other: None IMPRESSION: No acute bony abnormality. Electronically Signed   By: Rolm Baptise M.D.   On: 06/16/2022 02:13   CT HEAD WO CONTRAST (5MM)  Result Date: 06/16/2022 CLINICAL DATA:  Head trauma, moderate-severe.  Unresponsive. EXAM: CT HEAD WITHOUT CONTRAST TECHNIQUE: Contiguous axial images were obtained from the base of the skull through the vertex without  intravenous contrast. RADIATION DOSE REDUCTION: This exam was performed according to the departmental dose-optimization program which includes automated exposure control, adjustment of the mA and/or kV according to patient size and/or use of iterative reconstruction technique. COMPARISON:  11/16/2018 FINDINGS: Brain: No acute intracranial abnormality. Specifically, no hemorrhage, hydrocephalus, mass lesion, acute infarction, or significant intracranial injury. Vascular: No hyperdense vessel or unexpected calcification. Skull: No acute calvarial abnormality. Sinuses/Orbits: No acute findings Other: None IMPRESSION: No acute intracranial abnormality. Electronically Signed   By: Rolm Baptise M.D.   On: 06/16/2022 02:11    Procedures Procedures    Medications Ordered in ED Medications - No data to display  ED Course/ Medical Decision  Making/ A&P                             Medical Decision Making Amount and/or Complexity of Data Reviewed Labs: ordered. Radiology: ordered.   Differential Diagnosis considered includes, but not limited to: Arrhythmia; MI; vasovagal episode; toxidrome; PE; stroke   Patient presents to the emergency department after a syncopal episode.  This occurred after he got up from sleep to go to the bathroom.  He has had previous episodes in the past secondary to his chronic bradycardia and transient low blood pressures.  It appears that this is what happened again tonight.  His workup is reassuring.  EKG unchanged from prior.  CT head and cervical spine unremarkable.  Lab work without abnormalities.  He did not have any worrisome precursor symptoms and no current symptoms.  There was never any shortness of breath, not tachycardic, tachypneic, hypoxic.  No concern for PE.        Final Clinical Impression(s) / ED Diagnoses Final diagnoses:  None    Rx / DC Orders ED Discharge Orders     None         Antawan Mchugh, Gwenyth Allegra, MD 06/16/22 [phone]

## 2023-10-20 ENCOUNTER — Other Ambulatory Visit: Payer: Self-pay

## 2023-10-20 ENCOUNTER — Emergency Department (HOSPITAL_COMMUNITY)

## 2023-10-20 ENCOUNTER — Emergency Department (HOSPITAL_COMMUNITY)
Admission: EM | Admit: 2023-10-20 | Discharge: 2023-10-20 | Disposition: A | Discharge status: Home / Self Care | Attending: Emergency Medicine | Admitting: Emergency Medicine

## 2023-10-20 ENCOUNTER — Encounter (HOSPITAL_COMMUNITY): Payer: Self-pay

## 2023-10-20 DIAGNOSIS — S40021A Contusion of right upper arm, initial encounter: Secondary | ICD-10-CM | POA: Diagnosis not present

## 2023-10-20 DIAGNOSIS — M25521 Pain in right elbow: Secondary | ICD-10-CM

## 2023-10-20 DIAGNOSIS — S4991XA Unspecified injury of right shoulder and upper arm, initial encounter: Secondary | ICD-10-CM | POA: Diagnosis present

## 2023-10-20 DIAGNOSIS — M7989 Other specified soft tissue disorders: Secondary | ICD-10-CM | POA: Diagnosis not present

## 2023-10-20 DIAGNOSIS — X58XXXA Exposure to other specified factors, initial encounter: Secondary | ICD-10-CM | POA: Insufficient documentation

## 2023-10-20 DIAGNOSIS — Y9273 Farm field as the place of occurrence of the external cause: Secondary | ICD-10-CM | POA: Diagnosis not present

## 2023-10-20 MED ORDER — IBUPROFEN 600 MG PO TABS: 600.0000 mg | ORAL_TABLET | Freq: Four times a day (QID) | ORAL | 0 refills | Status: DC | PRN

## 2023-10-20 NOTE — Discharge Instructions (Signed)
 It seems that his more of a pain in the middle part of your elbow.  Potentially ligamentous or muscular.  Joint does not appear to be broken.  The sling can help with pain.  The Motrin  can also help with the pain.  Follow-up with orthopedic surgery if symptoms or not improving.

## 2023-10-20 NOTE — ED Triage Notes (Signed)
 Pt arrived from [facility] in [place] [place] for evaluation of possible DVT in Pts RUE. Pt reports a couple days ago noticing some pain, soreness and swelling to the area. Pt denies injury.

## 2023-10-20 NOTE — ED Provider Notes (Signed)
 [place] EMERGENCY DEPARTMENT AT [facility] Provider Note   CSN: [id number] Arrival date & time: 10/20/23  1447     Patient presents with: possible DVT   Benjamin Navarro is a 58 y.o. male.   HPI [place].  Has some pain and swelling in right elbow area.  Anterior medial aspect has some bruising.  Reportedly began this morning.  No injury.  Had not been doing anything particularly physical either.  No chest pain.  No trouble breathing.  Pain does go up and down a little bit from the bruising.  No fevers.  Not on blood thinners.    Prior to Admission medications   Medication Sig Start Date End Date Taking? Authorizing Provider  ibuprofen  (ADVIL ) 600 MG tablet Take 1 tablet (600 mg total) by mouth every 6 (six) hours as needed. 10/20/23  Yes Patsey Lot, MD  lidocaine  (LIDODERM ) 5 % Place 1 patch onto the skin daily. Remove & Discard patch within 12 hours or as directed by MD 11/30/19   Zada Carne C, PA-C    Allergies: Penicillin g and Iodinated contrast media    Review of Systems  Updated Vital Signs BP (!) 152/78 (BP Location: Left Arm)   Pulse (!) 45   Temp 98.2 F (36.8 C) (Oral)   Resp 16   Ht 5' 8 (1.727 m)   Wt 88.5 kg   SpO2 100%   BMI 29.65 kg/m   Physical Exam Vitals and nursing note reviewed.  Cardiovascular:     Rate and Rhythm: Normal rate.  Musculoskeletal:     Comments: Some ecchymosis medially on the antecubital area of the right upper extremity.  Good range of motion elbow.  Does have some mild fullness both proximal and distal to this area.  Joint not durable.  Strong radial pulse.  Sensation intact distally.  No definite erythema.  Neurological:     Mental Status: He is alert.     (all labs ordered are listed, but only abnormal results are displayed) Labs Reviewed - No data to display  EKG: None  Radiology: DG Elbow Complete Right Result Date: 10/20/2023 CLINICAL DATA:  Pain and swelling of the RIGHT  upper extremity EXAM: RIGHT ELBOW - COMPLETE 3+ VIEW COMPARISON:  None available FINDINGS: Lucency seen through the olecranon on the lateral view favored to be a vascular channel, where fracture difficult to completely exclude given moderate overlying soft tissue swelling. No additional fracture or dislocation. IMPRESSION: Lucency in the olecranon process, seen only on the lateral view, is favored to be vascular channel, however could also represent a nondisplaced fracture given moderate overlying soft tissue swelling. Electronically Signed   By: Aliene Lloyd M.D.   On: 10/20/2023 16:15   US  Venous Img Upper Uni Right(DVT) Result Date: 10/20/2023 CLINICAL DATA:  Sudden onset RIGHT upper extremity pain and swelling EXAM: RIGHT UPPER EXTREMITY VENOUS DOPPLER ULTRASOUND TECHNIQUE: Gray-scale sonography with graded compression, as well as color Doppler and duplex ultrasound were performed to evaluate the upper extremity deep venous system from the level of the subclavian vein and including the jugular, axillary, basilic, radial, ulnar and upper cephalic vein. Spectral Doppler was utilized to evaluate flow at rest and with distal augmentation maneuvers. COMPARISON:  None available FINDINGS: Contralateral Subclavian Vein: Respiratory phasicity is normal and symmetric with the symptomatic side. No evidence of thrombus. Normal compressibility. Internal Jugular Vein: No evidence of thrombus. Normal compressibility, respiratory phasicity and response to augmentation. Subclavian Vein: No  evidence of thrombus. Normal compressibility, respiratory phasicity and response to augmentation. Axillary Vein: No evidence of thrombus. Normal compressibility, respiratory phasicity and response to augmentation. Cephalic Vein: No evidence of thrombus. Normal compressibility, respiratory phasicity and response to augmentation. Basilic Vein: No evidence of thrombus. Normal compressibility, respiratory phasicity and response to augmentation.  Brachial Veins: No evidence of thrombus. Normal compressibility, respiratory phasicity and response to augmentation. Radial Veins: No evidence of thrombus. Normal compressibility, respiratory phasicity and response to augmentation. Ulnar Veins: No evidence of thrombus. Normal compressibility, respiratory phasicity and response to augmentation. Venous Reflux:  None visualized. Other Findings:  None visualized. IMPRESSION: No evidence of DVT within the  upper extremity. Electronically Signed   By: Aliene Lloyd M.D.   On: 10/20/2023 16:11     Procedures   Medications Ordered in the ED - No data to display                                  Medical Decision Making Amount and/or Complexity of Data Reviewed Radiology: ordered.  Risk Prescription drug management.   Patient with some swelling and ecchymosis of right antecubital area.  No trauma.  Will get x-ray and Doppler.  Differential diagnosis does include occult injury and DVT.  Negative x-ray negative Doppler.  Appears stable for [facility] the [facility].  Will give immobilizer for comfort.  Will limit use.  Have follow-up with Ortho or medical staff there.  Appears stable for discharge home.      Final diagnoses:  Right elbow pain    ED Discharge Orders          Ordered    ibuprofen  (ADVIL ) 600 MG tablet  Every 6 hours PRN        10/20/23 1628               Patsey Lot, MD 10/20/23 2302

## 2023-10-20 NOTE — ED Notes (Signed)
 Pt is placed in family waiting area

## 2023-10-20 NOTE — ED Notes (Signed)
 D/c instructions, prescription and follow up care reviewed with the patient who verbalized understanding. This RN also called the jail the patient is going back to and spoke with the RN there and gave report to him. He verbalized understanding. Pt A&xO4, ambulatory with independent steady gait.

## 2023-12-20 ENCOUNTER — Emergency Department (HOSPITAL_COMMUNITY)

## 2023-12-20 ENCOUNTER — Emergency Department (HOSPITAL_COMMUNITY)
Admission: EM | Admit: 2023-12-20 | Discharge: 2023-12-20 | Disposition: A | Discharge status: Home / Self Care | Attending: Emergency Medicine | Admitting: Emergency Medicine

## 2023-12-20 ENCOUNTER — Encounter (HOSPITAL_COMMUNITY): Payer: Self-pay

## 2023-12-20 ENCOUNTER — Other Ambulatory Visit: Payer: Self-pay

## 2023-12-20 DIAGNOSIS — S0083XA Contusion of other part of head, initial encounter: Secondary | ICD-10-CM | POA: Insufficient documentation

## 2023-12-20 DIAGNOSIS — S63501A Unspecified sprain of right wrist, initial encounter: Secondary | ICD-10-CM | POA: Diagnosis not present

## 2023-12-20 DIAGNOSIS — W01198A Fall on same level from slipping, tripping and stumbling with subsequent striking against other object, initial encounter: Secondary | ICD-10-CM | POA: Diagnosis not present

## 2023-12-20 DIAGNOSIS — M545 Low back pain, unspecified: Secondary | ICD-10-CM | POA: Diagnosis present

## 2023-12-20 DIAGNOSIS — S0093XA Contusion of unspecified part of head, initial encounter: Secondary | ICD-10-CM

## 2023-12-20 DIAGNOSIS — S335XXA Sprain of ligaments of lumbar spine, initial encounter: Secondary | ICD-10-CM | POA: Insufficient documentation

## 2023-12-20 NOTE — ED Triage Notes (Signed)
 Pt from [facility] complaining of a fall that he had in the kitchen. Pt stated that he hit the back of his head, twisted his wrist and his lower back hurts. Pt not taking blood thinners and did not have LOC

## 2023-12-20 NOTE — ED Provider Notes (Signed)
 [place] EMERGENCY DEPARTMENT AT [facility] Provider Note   CSN: [id number] Arrival date & time: 12/20/23  1640     Patient presents with: Benjamin Navarro is a 58 y.o. male.   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  Patient states that he hit his head and right hand.  Patient reports that he feels like he twisted his low back.  Patient states that his head did strike the hard floor.  Patient did not lose consciousness.  He denies any nausea or vomiting he denies any vision changes he has not had any hearing changes.  Patient has soreness when he moves his right wrist.  Patient states his low back is sore he has been able to walk without any difficulty.  The history is provided by the patient and the police. No language interpreter was used.  Fall       Prior to Admission medications   Medication Sig Start Date End Date Taking? Authorizing Provider  ibuprofen  (ADVIL ) 600 MG tablet Take 1 tablet (600 mg total) by mouth every 6 (six) hours as needed. 10/20/23   Patsey Lot, MD  lidocaine  (LIDODERM ) 5 % Place 1 patch onto the skin daily. Remove & Discard patch within 12 hours or as directed by MD 11/30/19   Zada Carne C, PA-C    Allergies: Penicillin g and Iodinated contrast media    Review of Systems  All other systems reviewed and are negative.   Updated Vital Signs BP (!) 140/80   Pulse (!) 49   Temp 98.1 F (36.7 C) (Oral)   Resp 18   Ht 5' 8 (1.727 m)   Wt 89.8 kg   SpO2 100%   BMI 30.11 kg/m   Physical Exam Vitals and nursing note reviewed.  Constitutional:      General: He is not in acute distress.    Appearance: Normal appearance. He is well-developed.  HENT:     Head: Normocephalic and atraumatic.     Comments: Tender occipital scalp    Right Ear: External ear normal.     Left Ear: External ear normal.     Nose: Nose normal.     Mouth/Throat:     Mouth: Mucous membranes are moist.   Eyes:     Conjunctiva/sclera: Conjunctivae normal.  Cardiovascular:     Rate and Rhythm: Normal rate and regular rhythm.     Heart sounds: No murmur heard. Pulmonary:     Effort: Pulmonary effort is normal. No respiratory distress.     Breath sounds: Normal breath sounds.  Abdominal:     Palpations: Abdomen is soft.     Tenderness: There is no abdominal tenderness.  Musculoskeletal:        General: Tenderness present. No swelling.     Cervical back: Neck supple.     Comments: Tender right wrist full range of motion neurovascular neurosensory intact  Skin:    General: Skin is warm and dry.     Capillary Refill: Capillary refill takes less than 2 seconds.  Neurological:     Mental Status: He is alert.  Psychiatric:        Mood and Affect: Mood normal.     (all labs ordered are listed, but only abnormal results are displayed) Labs Reviewed - No data to display  EKG: None  Radiology: CT Head Wo Contrast Result Date: 12/20/2023 CLINICAL DATA:  Fall, hit back of head.  EXAM: CT HEAD WITHOUT CONTRAST TECHNIQUE: Contiguous axial images were obtained from the base of the skull through the vertex without intravenous contrast. RADIATION DOSE REDUCTION: This exam was performed according to the departmental dose-optimization program which includes automated exposure control, adjustment of the mA and/or kV according to patient size and/or use of iterative reconstruction technique. COMPARISON:  06/16/2022 FINDINGS: Brain: No acute intracranial abnormality. Specifically, no hemorrhage, hydrocephalus, mass lesion, acute infarction, or significant intracranial injury. Vascular: No hyperdense vessel or unexpected calcification. Skull: No acute calvarial abnormality. Sinuses/Orbits: No acute findings Other: None IMPRESSION: No acute intracranial abnormality. Electronically Signed   By: Franky Crease M.D.   On: 12/20/2023 18:13   DG Wrist Complete Right Result Date: 12/20/2023 CLINICAL DATA:  Clemens,  wrist injury EXAM: RIGHT WRIST - COMPLETE 3+ VIEW COMPARISON:  None Available. FINDINGS: Frontal, oblique, lateral, and ulnar deviated views of the right wrist are obtained on 4 images. No fracture, subluxation, or dislocation. Joint spaces are well preserved. Soft tissues are unremarkable. IMPRESSION: 1. No acute displaced fracture. Electronically Signed   By: Ozell Daring M.D.   On: 12/20/2023 17:15     Procedures   Medications Ordered in the ED - No data to display                                  Medical Decision Making Patient reports that he slipped and fell.  He twisted his back hit the back of his head on a hard floor and injured his right wrist.  Amount and/or Complexity of Data Reviewed Independent Historian:     Details: Patient is here in custody. Radiology: ordered and independent interpretation performed. Decision-making details documented in ED Course.    Details: CT head shows no acute findings X-ray right wrist shows no acute abnormality  Risk Risk Details: Patient counseled on x-ray results.  He is advised ibuprofen  or Tylenol  for discomfort.  Patient is discharged in stable condition.        Final diagnoses:  Sprain of right wrist, initial encounter  Contusion of head, unspecified part of head, initial encounter  Sprain of low back, initial encounter    ED Discharge Orders     None       An After Visit Summary was printed and given to the patient.    Flint Sonny MARLA DEVONNA 12/20/23 2215    Cleotilde Rogue, MD 12/21/23 [phone]

## 2023-12-20 NOTE — Discharge Instructions (Addendum)
Return if any problems. Ibuprofen or tylenol for discomfort

## 2024-02-24 ENCOUNTER — Other Ambulatory Visit: Payer: Self-pay

## 2024-02-24 ENCOUNTER — Encounter (HOSPITAL_COMMUNITY): Payer: Self-pay | Admitting: Emergency Medicine

## 2024-02-24 ENCOUNTER — Observation Stay (HOSPITAL_COMMUNITY)
Admission: EM | Admit: 2024-02-24 | Discharge: 2024-02-26 | Disposition: A | Discharge status: Home / Self Care | Attending: Internal Medicine | Admitting: Internal Medicine

## 2024-02-24 DIAGNOSIS — Z79899 Other long term (current) drug therapy: Secondary | ICD-10-CM | POA: Insufficient documentation

## 2024-02-24 DIAGNOSIS — Z8659 Personal history of other mental and behavioral disorders: Secondary | ICD-10-CM | POA: Diagnosis not present

## 2024-02-24 DIAGNOSIS — R079 Chest pain, unspecified: Secondary | ICD-10-CM | POA: Diagnosis present

## 2024-02-24 DIAGNOSIS — R600 Localized edema: Secondary | ICD-10-CM | POA: Insufficient documentation

## 2024-02-24 DIAGNOSIS — E782 Mixed hyperlipidemia: Secondary | ICD-10-CM | POA: Diagnosis not present

## 2024-02-24 DIAGNOSIS — R001 Bradycardia, unspecified: Secondary | ICD-10-CM | POA: Insufficient documentation

## 2024-02-24 DIAGNOSIS — E669 Obesity, unspecified: Secondary | ICD-10-CM | POA: Diagnosis not present

## 2024-02-24 DIAGNOSIS — R0789 Other chest pain: Principal | ICD-10-CM | POA: Insufficient documentation

## 2024-02-24 DIAGNOSIS — Z87891 Personal history of nicotine dependence: Secondary | ICD-10-CM | POA: Insufficient documentation

## 2024-02-24 DIAGNOSIS — Z6830 Body mass index (BMI) 30.0-30.9, adult: Secondary | ICD-10-CM | POA: Diagnosis not present

## 2024-02-24 DIAGNOSIS — I10 Essential (primary) hypertension: Secondary | ICD-10-CM | POA: Insufficient documentation

## 2024-02-24 LAB — CBC WITH DIFFERENTIAL/PLATELET
Abs Immature Granulocytes: 0.02 K/uL (ref 0.00–0.07)
Basophils Absolute: 0.1 K/uL (ref 0.0–0.1)
Basophils Relative: 1 %
Eosinophils Absolute: 0.8 K/uL — ABNORMAL HIGH (ref 0.0–0.5)
Eosinophils Relative: 11 %
HCT: 40.3 % (ref 39.0–52.0)
Hemoglobin: 12.8 g/dL — ABNORMAL LOW (ref 13.0–17.0)
Immature Granulocytes: 0 %
Lymphocytes Relative: 33 %
Lymphs Abs: 2.2 K/uL (ref 0.7–4.0)
MCH: 28.7 pg (ref 26.0–34.0)
MCHC: 31.8 g/dL (ref 30.0–36.0)
MCV: 90.4 fL (ref 80.0–100.0)
Monocytes Absolute: 0.8 K/uL (ref 0.1–1.0)
Monocytes Relative: 12 %
Neutro Abs: 2.9 K/uL (ref 1.7–7.7)
Neutrophils Relative %: 43 %
Platelets: 263 K/uL (ref 150–400)
RBC: 4.46 MIL/uL (ref 4.22–5.81)
RDW: 14.3 % (ref 11.5–15.5)
WBC: 6.7 K/uL (ref 4.0–10.5)
nRBC: 0 % (ref 0.0–0.2)

## 2024-02-24 LAB — CBG MONITORING, ED: Glucose-Capillary: 144 mg/dL — ABNORMAL HIGH (ref 70–99)

## 2024-02-24 MED ORDER — OXYCODONE-ACETAMINOPHEN 5-325 MG PO TABS
1.0000 | ORAL_TABLET | Freq: Once | ORAL | Status: AC
  Administered 2024-02-24: 1 via ORAL
  Filled 2024-02-24: qty 1

## 2024-02-24 MED ORDER — NITROGLYCERIN 2 % TD OINT
1.0000 [in_us] | TOPICAL_OINTMENT | Freq: Once | TRANSDERMAL | Status: AC
  Administered 2024-02-24: 1 [in_us] via TOPICAL
  Filled 2024-02-24: qty 1

## 2024-02-24 NOTE — ED Provider Notes (Signed)
 [place] EMERGENCY DEPARTMENT AT [facility] Provider Note   CSN: [id number] Arrival date & time: 02/24/24  2326     Patient presents with: No chief complaint on file.   Benjamin Navarro is a 58 y.o. male.  {Add pertinent medical, surgical, social history, OB history to YEP:67052} HPI Patient presents for chest pain.  Medical history includes bradycardia, depression, smoking history.  Earlier today, patient was in his normal state of health.  At around 10:15 PM, he had acute onset of left-sided chest pain while at rest.  Pain was initially 10/10 in severity.  It did improve with EMS.  He received 324 of ASA and 1 SL NTG prior to arrival.  He has recently been taking Benadryl for symptomatic relief of a pruritic rash on his back.  He does not take any other daily medications.  Current pain severity is 6/10.  He denies any associated symptoms.    Prior to Admission medications   Medication Sig Start Date End Date Taking? Authorizing Provider  ibuprofen  (ADVIL ) 600 MG tablet Take 1 tablet (600 mg total) by mouth every 6 (six) hours as needed. 10/20/23   Patsey Lot, MD  lidocaine  (LIDODERM ) 5 % Place 1 patch onto the skin daily. Remove & Discard patch within 12 hours or as directed by MD 11/30/19   Zada Carne C, PA-C    Allergies: Penicillin g and Iodinated contrast media    Review of Systems  Cardiovascular:  Positive for chest pain.  Skin:  Positive for rash.  All other systems reviewed and are negative.   Updated Vital Signs There were no vitals taken for this visit.  Physical Exam Vitals and nursing note reviewed.  Constitutional:      General: He is not in acute distress.    Appearance: He is well-developed. He is not ill-appearing, toxic-appearing or diaphoretic.  HENT:     Head: Normocephalic and atraumatic.     Right Ear: External ear normal.     Left Ear: External ear normal.     Nose: Nose normal.     Mouth/Throat:     Mouth: Mucous membranes are  moist.  Eyes:     Extraocular Movements: Extraocular movements intact.     Conjunctiva/sclera: Conjunctivae normal.  Cardiovascular:     Rate and Rhythm: Normal rate and regular rhythm.  Pulmonary:     Effort: Pulmonary effort is normal. No respiratory distress.  Abdominal:     General: There is no distension.     Palpations: Abdomen is soft.  Musculoskeletal:        General: No swelling. Normal range of motion.     Cervical back: Normal range of motion.  Skin:    General: Skin is warm and dry.     Coloration: Skin is not jaundiced or pale.  Neurological:     General: No focal deficit present.     Mental Status: He is alert and oriented to person, place, and time.  Psychiatric:        Mood and Affect: Mood normal.        Behavior: Behavior normal.     (all labs ordered are listed, but only abnormal results are displayed) Labs Reviewed - No data to display  EKG: None  Radiology: No results found.  {Document cardiac monitor, telemetry assessment procedure when appropriate:32947} Procedures   Medications Ordered in the ED - No data to display    {Click here for ABCD2, HEART and other calculators REFRESH Note before  signing:1}                              Medical Decision Making  This patient presents to the ED for concern of ***, this involves an extensive number of treatment options, and is a complaint that carries with it a high risk of complications and morbidity.  The differential diagnosis includes ***   Co morbidities / Chronic conditions that complicate the patient evaluation  ***   Additional history obtained:  Additional history obtained from EMR External records from outside source obtained and reviewed including ***   Lab Tests:  I Ordered, and personally interpreted labs.  The pertinent results include:  ***   Imaging Studies ordered:  I ordered imaging studies including ***  I independently visualized and interpreted imaging which showed  *** I agree with the radiologist interpretation   Cardiac Monitoring: / EKG:  The patient was maintained on a cardiac monitor.  I personally viewed and interpreted the cardiac monitored which showed an underlying rhythm of: ***   Problem List / ED Course / Critical interventions / Medication management  Patient presenting for acute onset of left-sided chest pain.  EMS noted initial SBP in the 180s.  His blood pressure and pain did improve prior to arrival.  On arrival in the ED, patient is well-appearing.  Current pain is 6/10.  Percocet was ordered for analgesia.  Workup was initiated.*** I ordered medication including ***   Reevaluation of the patient after these medicines showed that the patient *** I have reviewed the patients home medicines and have made adjustments as needed   Consultations Obtained:  I requested consultation with the ***,  and discussed lab and imaging findings as well as pertinent plan - they recommend: ***   Social Determinants of Health:  ***   Test / Admission - Considered:  ***   {Document critical care time when appropriate  Document review of labs and clinical decision tools ie CHADS2VASC2, etc  Document your independent review of radiology images and any outside records  Document your discussion with family members, caretakers and with consultants  Document social determinants of health affecting pt's care  Document your decision making why or why not admission, treatments were needed:32947:::1}   Final diagnoses:  None    ED Discharge Orders     None

## 2024-02-24 NOTE — ED Triage Notes (Signed)
 Pt c/o chest pain 10/10 when ems arrived. Pt received 324mg  of aspirin  and 1 nitroglycerin , pain is now 6/10. Pt c/o [facility] at [facility] and was given benadryl. He states the pain was radiating to the left shoulder upper arm area.

## 2024-02-25 ENCOUNTER — Emergency Department (HOSPITAL_COMMUNITY)

## 2024-02-25 ENCOUNTER — Observation Stay (HOSPITAL_COMMUNITY)

## 2024-02-25 ENCOUNTER — Observation Stay (HOSPITAL_BASED_OUTPATIENT_CLINIC_OR_DEPARTMENT_OTHER)

## 2024-02-25 DIAGNOSIS — R079 Chest pain, unspecified: Secondary | ICD-10-CM | POA: Diagnosis not present

## 2024-02-25 DIAGNOSIS — I10 Essential (primary) hypertension: Secondary | ICD-10-CM | POA: Insufficient documentation

## 2024-02-25 DIAGNOSIS — R001 Bradycardia, unspecified: Secondary | ICD-10-CM

## 2024-02-25 LAB — BASIC METABOLIC PANEL WITH GFR
Anion gap: 11 (ref 5–15)
Anion gap: 9 (ref 5–15)
BUN: 13 mg/dL (ref 6–20)
BUN: 13 mg/dL (ref 6–20)
CO2: 24 mmol/L (ref 22–32)
CO2: 26 mmol/L (ref 22–32)
Calcium: 9 mg/dL (ref 8.9–10.3)
Calcium: 9.1 mg/dL (ref 8.9–10.3)
Chloride: 105 mmol/L (ref 98–111)
Chloride: 105 mmol/L (ref 98–111)
Creatinine, Ser: 0.89 mg/dL (ref 0.61–1.24)
Creatinine, Ser: 0.98 mg/dL (ref 0.61–1.24)
GFR, Estimated: >60 mL/min (ref >60)
GFR, Estimated: >60 mL/min (ref >60)
Glucose, Bld: 118 mg/dL — ABNORMAL HIGH (ref 70–99)
Glucose, Bld: 122 mg/dL — ABNORMAL HIGH (ref 70–99)
Potassium: 4.2 mmol/L (ref 3.5–5.1)
Potassium: 4.6 mmol/L (ref 3.5–5.1)
Sodium: 139 mmol/L (ref 135–145)
Sodium: 140 mmol/L (ref 135–145)

## 2024-02-25 LAB — MAGNESIUM
Magnesium: 2.1 mg/dL (ref 1.7–2.4)
Magnesium: 2.2 mg/dL (ref 1.7–2.4)

## 2024-02-25 LAB — LIPID PANEL
Cholesterol: 187 mg/dL (ref 0–200)
HDL: 41 mg/dL (ref >40)
LDL Cholesterol: 115 mg/dL — ABNORMAL HIGH (ref 0–99)
Total CHOL/HDL Ratio: 4.6 ratio
Triglycerides: 155 mg/dL — ABNORMAL HIGH (ref <150)
VLDL: 31 mg/dL (ref 0–40)

## 2024-02-25 LAB — CBC
HCT: 40.6 % (ref 39.0–52.0)
Hemoglobin: 13 g/dL (ref 13.0–17.0)
MCH: 28.8 pg (ref 26.0–34.0)
MCHC: 32 g/dL (ref 30.0–36.0)
MCV: 89.8 fL (ref 80.0–100.0)
Platelets: 265 K/uL (ref 150–400)
RBC: 4.52 MIL/uL (ref 4.22–5.81)
RDW: 14.3 % (ref 11.5–15.5)
WBC: 6.6 K/uL (ref 4.0–10.5)
nRBC: 0 % (ref 0.0–0.2)

## 2024-02-25 LAB — ECHOCARDIOGRAM COMPLETE
Area-P 1/2: 3.27 cm2
Height: 68 in
S' Lateral: 2.5 cm
Weight: 3167.57 [oz_av]

## 2024-02-25 LAB — HIV ANTIBODY (ROUTINE TESTING W REFLEX): HIV Screen 4th Generation wRfx: NONREACTIVE

## 2024-02-25 LAB — MRSA NEXT GEN BY PCR, NASAL: MRSA by PCR Next Gen: NOT DETECTED

## 2024-02-25 LAB — TROPONIN T, HIGH SENSITIVITY
Troponin T High Sensitivity: <15 ng/L (ref 0–19)
Troponin T High Sensitivity: <15 ng/L (ref 0–19)

## 2024-02-25 LAB — PHOSPHORUS: Phosphorus: 3.8 mg/dL (ref 2.5–4.6)

## 2024-02-25 LAB — LIPASE, BLOOD: Lipase: 17 U/L (ref 11–51)

## 2024-02-25 MED ORDER — ENOXAPARIN SODIUM 40 MG/0.4ML IJ SOSY: 40.0000 mg | PREFILLED_SYRINGE | INTRAMUSCULAR | Status: DC

## 2024-02-25 MED ORDER — PROCHLORPERAZINE EDISYLATE 10 MG/2ML IJ SOLN
5.0000 mg | Freq: Four times a day (QID) | INTRAMUSCULAR | Status: DC | PRN
  Administered 2024-02-25: 5 mg via INTRAVENOUS
  Filled 2024-02-25 (×2): qty 2

## 2024-02-25 MED ORDER — HYDROMORPHONE HCL 1 MG/ML IJ SOLN
0.5000 mg | Freq: Once | INTRAMUSCULAR | Status: AC
  Administered 2024-02-25: 0.5 mg via INTRAVENOUS
  Filled 2024-02-25: qty 0.5

## 2024-02-25 MED ORDER — ASPIRIN 81 MG PO TBEC
81.0000 mg | DELAYED_RELEASE_TABLET | Freq: Every day | ORAL | Status: DC
  Administered 2024-02-25 – 2024-02-26 (×2): 81 mg via ORAL
  Filled 2024-02-25 (×2): qty 1

## 2024-02-25 MED ORDER — ALUM & MAG HYDROXIDE-SIMETH 200-200-20 MG/5ML PO SUSP: 30.0000 mL | Freq: Once | ORAL | Status: DC

## 2024-02-25 MED ORDER — HYDRALAZINE HCL 20 MG/ML IJ SOLN
5.0000 mg | Freq: Once | INTRAMUSCULAR | Status: AC
  Administered 2024-02-25: 5 mg via INTRAVENOUS
  Filled 2024-02-25: qty 1

## 2024-02-25 MED ORDER — LACTATED RINGERS IV SOLN: INTRAVENOUS | Status: AC

## 2024-02-25 MED ORDER — MELATONIN 3 MG PO TABS: 6.0000 mg | ORAL_TABLET | Freq: Every evening | ORAL | Status: DC | PRN

## 2024-02-25 MED ORDER — ENOXAPARIN SODIUM 40 MG/0.4ML IJ SOSY
40.0000 mg | PREFILLED_SYRINGE | INTRAMUSCULAR | Status: DC
  Administered 2024-02-25: 40 mg via SUBCUTANEOUS
  Filled 2024-02-25: qty 0.4

## 2024-02-25 MED ORDER — ACETAMINOPHEN 325 MG PO TABS
650.0000 mg | ORAL_TABLET | Freq: Four times a day (QID) | ORAL | Status: DC | PRN
  Administered 2024-02-25: 650 mg via ORAL
  Filled 2024-02-25: qty 2

## 2024-02-25 MED ORDER — AMLODIPINE BESYLATE 5 MG PO TABS
5.0000 mg | ORAL_TABLET | Freq: Every day | ORAL | Status: DC
  Administered 2024-02-25 – 2024-02-26 (×2): 5 mg via ORAL
  Filled 2024-02-25 (×2): qty 1

## 2024-02-25 MED ORDER — CHLORHEXIDINE GLUCONATE CLOTH 2 % EX PADS
6.0000 | MEDICATED_PAD | Freq: Every day | CUTANEOUS | Status: DC
  Administered 2024-02-25: 6 via TOPICAL

## 2024-02-25 MED ORDER — POLYETHYLENE GLYCOL 3350 17 G PO PACK: 17.0000 g | PACK | Freq: Every day | ORAL | Status: DC | PRN

## 2024-02-25 MED ORDER — LIDOCAINE VISCOUS HCL 2 % MT SOLN: 15.0000 mL | Freq: Once | OROMUCOSAL | Status: DC

## 2024-02-25 MED ORDER — ROSUVASTATIN CALCIUM 20 MG PO TABS
20.0000 mg | ORAL_TABLET | Freq: Every day | ORAL | Status: DC
  Administered 2024-02-25 – 2024-02-26 (×2): 20 mg via ORAL
  Filled 2024-02-25 (×2): qty 1

## 2024-02-25 MED ORDER — FUROSEMIDE 10 MG/ML IJ SOLN
20.0000 mg | Freq: Once | INTRAMUSCULAR | Status: AC
  Administered 2024-02-25: 20 mg via INTRAVENOUS
  Filled 2024-02-25: qty 2

## 2024-02-25 MED ORDER — TRIAMCINOLONE ACETONIDE 0.1 % EX CREA
TOPICAL_CREAM | Freq: Two times a day (BID) | CUTANEOUS | Status: DC
  Administered 2024-02-25: 1 via TOPICAL
  Filled 2024-02-25: qty 15

## 2024-02-25 NOTE — ED Notes (Addendum)
 Benjamin Navarro

## 2024-02-25 NOTE — H&P (Signed)
 History and Physical    Patient: Benjamin Navarro FMW:[id number] DOB: 03-Sep-1965 DOA: 02/24/2024 DOS: the patient was seen and examined on 02/25/2024 PCP: Alvan Dorn FALCON, MD  [facility] from: jail  Chief Complaint: Chest pain HPI: JEURY Navarro is a 58 year old male with a history of depression, bradycardia, tobacco abuse presenting with chest pain.  The patient states that he developed chest pain while at rest on the evening of 02/23/2024.  He stated it lasted about 5 minutes.  Once again, he developed another episode of substernal chest pain on 02/24/2024 around 10:30 PM lasting about 5 minutes.  He was in bed at the time.  He stated that he has some shortness of breath.  He denied any fever, chills, coughing, hemoptysis.  Palpitation, dizziness, nausea, vomiting, diarrhea, abdominal pain.  He states that he was recently given some Benadryl for some rash on his back.  He is not on any other medications. He denies any previous history of coronary disease.  He previously smoked 1 pack/day x 18 years.  He states he quit over 10 years ago.  Patient was given sublingual nitroglycerin  by EMS with improvement of his chest pressure.  In the ED, the patient was afebrile and hemodynamically stable with oxygen saturation 96% room air.  WBC 6.7, hemoglobin 12.8, platelets 263.  Sodium 140, potassium 4.6, bicarbonate 26, serum creatinine 0.98.  Magnesium 2.1.  Lipase 17.  Troponin < 15 x 2.  Chest x-ray was negative for infiltrates or edema. EKG showed sinus bradycardia with nonspecific T wave changes.  Cardiology was consulted to assist with management.  Review of Systems: As mentioned in the history of present illness. All other systems reviewed and are negative. Past Medical History:  Diagnosis Date   Bradycardia    Depression    Renal disorder    kidney stones   Suicidal ideations    Past Surgical History:  Procedure Laterality Date   KNEE SURGERY Right    pt says he has pins in his knee    LITHOTRIPSY  2000   TONSILLECTOMY     Social History:  reports that he has quit smoking. His smoking use included cigarettes. He has a 31 pack-year smoking history. He has never used smokeless tobacco. He reports that he does not currently use alcohol. He reports that he does not currently use drugs after having used the following drugs: Marijuana.  Allergies  Allergen Reactions   Penicillin G Hives    .Did it involve swelling of the face/tongue/throat, SOB, or low BP? No Did it involve sudden or severe rash/hives, skin peeling, or any reaction on the inside of your mouth or nose? Yes Did you need to seek medical attention at a hospital or doctor's office? Yes When did it last happen?      2019 If all above answers are NO, may proceed with cephalosporin use.    Iodinated Contrast Media Rash    Family History  Problem Relation Age of Onset   Hypertension Mother    Cancer Mother    Hypertension Father    Cancer Father    Hypertension Sister    Hypertension Brother    Hypertension Sister    Hypertension Sister     Prior to Admission medications   Medication Sig Start Date End Date Taking? Authorizing Provider  acetaminophen -codeine (TYLENOL  #3) 300-30 MG tablet Take by mouth every 6 (six) hours as needed for moderate pain (pain score 4-6). 03/20/20  Yes [provider]  ibuprofen  (ADVIL )  600 MG tablet Take 1 tablet (600 mg total) by mouth every 6 (six) hours as needed. 10/20/23  Yes Patsey Lot, MD  lidocaine  (LIDODERM ) 5 % Place 1 patch onto the skin daily. Remove & Discard patch within 12 hours or as directed by MD 11/30/19  Yes Joy, Shawn C, PA-C  nicotine  (NICODERM CQ  - DOSED IN MG/24 HOURS) 21 mg/24hr patch Place 1 patch onto the skin daily. 09/21/21  Yes [provider]    Physical Exam: Vitals:   02/25/24 0305 02/25/24 0315 02/25/24 0317 02/25/24 0630  BP:  (!) 150/99  129/85  Pulse:  (!) 59 62 (!) 57  Resp:  (!) 23 19 18   Temp:      SpO2: 96%  95% 96% 96%  Weight:      Height:       GENERAL:  A&O x 3, NAD, well developed, cooperative, follows commands HEENT: [place]/AT, No thrush, No icterus, No oral ulcers Neck:  No neck mass, No meningismus, soft, supple CV: RRR, no S3, no S4, no rub, no JVD Lungs: Bibasilar crackles.  No wheezing.  Good air movement Abd: soft/NT +BS, nondistended Ext: No edema, no lymphangitis, no cyanosis, no rashes Neuro:  CN II-XII intact, strength 4/5 in RUE, RLE, strength 4/5 LUE, LLE; sensation intact bilateral; no dysmetria; babinski equivocal  Data Reviewed: Data reviewed above in the history  Assessment and Plan: Chest pain - Troponin negative x 2 - Echocardiogram - Personally reviewed EKG--sinus rhythm, nonspecific T wave changes  Essential hypertension - Was given IV hydralazine in the ED - Plan to start antihypertensive therapy  Obesity - BMI 30.10 - Lifestyle modification   Advance Care Planning: FULL  Consults: cardiology  Family Communication: none present  Severity of Illness: The appropriate patient status for this patient is OBSERVATION. Observation status is judged to be reasonable and necessary in order to provide the required intensity of service to ensure the patient's safety. The patient's presenting symptoms, physical exam findings, and initial radiographic and laboratory data in the context of their medical condition is felt to place them at decreased risk for further clinical deterioration. Furthermore, it is anticipated that the patient will be medically stable for discharge from the hospital within 2 midnights of admission.   Author: Alm Schneider, MD 02/25/2024 8:15 AM  For on call review [email or web address].

## 2024-02-25 NOTE — Hospital Course (Addendum)
 58 year old male with a history of depression, bradycardia, tobacco abuse presenting with chest pain.  The patient states that he developed chest pain while at rest on the evening of 02/23/2024.  He stated it lasted about 5 minutes.  Once again, he developed another episode of substernal chest pain on 02/24/2024 around 10:30 PM lasting about 5 minutes.  He was in bed at the time.  He stated that he has some shortness of breath.  He denied any fever, chills, coughing, hemoptysis.  Palpitation, dizziness, nausea, vomiting, diarrhea, abdominal pain.  He states that he was recently given some Benadryl for some rash on his back.  He is not on any other medications. He denies any previous history of coronary disease.  He previously smoked 1 pack/day x 18 years.  He states he quit over 10 years ago.  Patient was given sublingual nitroglycerin  by EMS with improvement of his chest pressure.  In the ED, the patient was afebrile and hemodynamically stable with oxygen saturation 96% room air.  WBC 6.7, hemoglobin 12.8, platelets 263.  Sodium 140, potassium 4.6, bicarbonate 26, serum creatinine 0.98.  Magnesium 2.1.  Lipase 17.  Troponin < 15 x 2.  Chest x-ray was negative for infiltrates or edema. EKG showed sinus bradycardia with nonspecific T wave changes.  Cardiology was consulted to assist with management.

## 2024-02-25 NOTE — Plan of Care (Signed)

## 2024-02-25 NOTE — Consult Note (Signed)
 Cardiology Consultation   Patient ID: Benjamin Navarro MRN: [id number]; DOB: 1965/11/29  Admit date: 02/24/2024 Date of Consult: 02/25/2024  PCP:  Alvan Dorn FALCON, MD   [facility] Providers Cardiologist:  Alvan Dorn, MD        Patient Profile:  Benjamin Navarro is a 58 y.o. male with a hx of bradycardia, depression and history of tobacco use who is being seen 02/25/2024 for the evaluation of chest pain at the request of Dr. Shona.  History of Present Illness:  Benjamin Navarro presented to [facility] yesterday evening for evaluation of chest pain which was radiating to his left shoulder. Initial labs showed WBC 6.7, Hgb 12.8, platelets 263, Na+ 140, K+ 4.6 and creatinine 0.98. Troponin T values negative at less than 15. FLP shows total cholesterol 187, triglycerides 155, HDL 41 and LDL 115. CXR with no acute cardiopulmonary abnormalities. EKG shows sinus bradycardia, HR 54 with slight TWI along Leads III, AVF, V3-V6.  In talking with the patient today, he reports having 2 episodes of chest pain over the past 2 days. Both episodes have occurred at night when lying down to go to sleep. Reports this is a sharp pain which radiates into his left shoulder. Reports a pins and needle sensation along his arms bilaterally at times.  His pain will last for 15 to 20 minutes and then resolve. He is unaware of any specific food triggers but says he has been consuming potato chips and Ramen noodles regularly. [place] prison during the [place] and denies any exertional chest pain with this. Breathing has been at baseline. No specific orthopnea or PND. He does have lower extremity edema on examination today but was unaware of this.  He is unaware of any personal history of CAD, CHF or cardiac arrhythmias. No known family history of cardiac issues. He is a former smoker but quit 10+ years ago. Says BP has been elevated when checked but he is not currently on medications for this.   Past  Medical History:  Diagnosis Date   Bradycardia    Depression    Renal disorder    kidney stones   Suicidal ideations     Past Surgical History:  Procedure Laterality Date   KNEE SURGERY Right    pt says he has pins in his knee   LITHOTRIPSY  2000   TONSILLECTOMY       Home Medications:  Prior to Admission medications   Medication Sig Start Date End Date Taking? Authorizing Provider  acetaminophen -codeine (TYLENOL  #3) 300-30 MG tablet Take by mouth every 6 (six) hours as needed for moderate pain (pain score 4-6). 03/20/20  Yes [provider]  ibuprofen  (ADVIL ) 600 MG tablet Take 1 tablet (600 mg total) by mouth every 6 (six) hours as needed. 10/20/23  Yes Patsey Lot, MD  lidocaine  (LIDODERM ) 5 % Place 1 patch onto the skin daily. Remove & Discard patch within 12 hours or as directed by MD 11/30/19  Yes Joy, Shawn C, PA-C  nicotine  (NICODERM CQ  - DOSED IN MG/24 HOURS) 21 mg/24hr patch Place 1 patch onto the skin daily. 09/21/21  Yes [provider]    Scheduled Meds:  amLODipine  5 mg Oral Daily   aspirin  EC  81 mg Oral Daily   furosemide  20 mg Intravenous Once   rosuvastatin  20 mg Oral Daily   Continuous Infusions:  lactated ringers 75 mL/hr at 02/25/24 0859   PRN Meds: acetaminophen , melatonin, polyethylene  glycol, prochlorperazine  Allergies:    Allergies  Allergen Reactions   Penicillin G Hives    .Did it involve swelling of the face/tongue/throat, SOB, or low BP? No Did it involve sudden or severe rash/hives, skin peeling, or any reaction on the inside of your mouth or nose? Yes Did you need to seek medical attention at a hospital or doctor's office? Yes When did it last happen?      2019 If all above answers are NO, may proceed with cephalosporin use.    Iodinated Contrast Media Rash    Social History:   Social History   Socioeconomic History   Marital status: Single    Spouse name: Not on file   Number of children: Not on file    Years of education: Not on file   Highest education level: Not on file  Occupational History   Not on file  Tobacco Use   Smoking status: Former    Current packs/day: 1.00    Average packs/day: 1 pack/day for 31.0 years (31.0 ttl pk-yrs)    Types: Cigarettes   Smokeless tobacco: Never  Vaping Use   Vaping status: Never Used  Substance and Sexual Activity   Alcohol use: Not Currently    Comment: occ beer on special occasions   Drug use: Not Currently    Types: Marijuana    Comment: not forthcoming.  +UDS  January 2020   Sexual activity: Not Currently  Other Topics Concern   Not on file  Social History Narrative   Pt lives alone in [place]    Family History:    Family History  Problem Relation Age of Onset   Hypertension Mother    Cancer Mother    Hypertension Father    Cancer Father    Hypertension Sister    Hypertension Brother    Hypertension Sister    Hypertension Sister      ROS:  Please see the history of present illness.   All other ROS reviewed and negative.     Physical Exam/Data: Vitals:   02/25/24 0315 02/25/24 0317 02/25/24 0630 02/25/24 0859  BP: (!) 150/99  129/85   Pulse: (!) 59 62 (!) 57   Resp: (!) 23 19 18    Temp:    98.3 F (36.8 C)  TempSrc:    Oral  SpO2: 95% 96% 96%   Weight:      Height:       No intake or output data in the 24 hours ending 02/25/24 0902    02/24/2024   11:34 PM 12/20/2023    4:51 PM 10/20/2023    2:55 PM  Last 3 Weights  Weight (lbs) 197 lb 15.6 oz    Weight (kg) 89.8 kg       Information is confidential and restricted. Go to Review Flowsheets to unlock data.     Body mass index is 30.1 kg/m.  General:  Well nourished, well developed male appearing in no acute distress HEENT: normal Neck: no JVD Vascular: No carotid bruits; Distal pulses 2+ bilaterally Cardiac:  normal S1, S2; RRR; no murmur  Lungs:  clear to auscultation bilaterally, no wheezing, rhonchi or rales  Abd: soft, nontender, no hepatomegaly  Ext:  1+ pitting edema bilaterally Musculoskeletal:  No deformities, BUE and BLE strength normal and equal Skin: warm and dry  Neuro:  CNs 2-12 intact, no focal abnormalities noted Psych:  Normal affect   EKG:  The EKG was personally reviewed and demonstrates: Sinus bradycardia, HR 54  with slight TWI along Leads III, AVF, V3-V6.  Telemetry:  Telemetry was personally reviewed and demonstrates: Sinus bradycardia, HR in 40's to 50's.   Relevant CV Studies:  Echocardiogram: 11/2019 IMPRESSIONS     1. Left ventricular ejection fraction, by estimation, is 60 to 65%. The  left ventricle has normal function. The left ventricle has no regional  wall motion abnormalities. Left ventricular diastolic parameters were  normal.   2. Right ventricular systolic function is normal. The right ventricular  size is normal. Tricuspid regurgitation signal is inadequate for assessing  PA pressure.   3. The mitral valve is grossly normal. Trivial mitral valve  regurgitation.   4. The aortic valve is tricuspid. Aortic valve regurgitation is not  visualized.   5. The inferior vena cava is normal in size with greater than 50%  respiratory variability, suggesting right atrial pressure of 3 mmHg.   Event Monitor: 12/2019 48 hour monitor Min HR 33, Max HR 127, Avg HR 44 Rare supraventricular ectopy in the form of isolated PACs, triplets Rare ventricular ectopy in the form of isolated PVCs Primary rhythm is sinus bradycardia throughout the study, average HR 44 No symptoms reported  Laboratory Data: High Sensitivity Troponin:  No results for input(s): TROPONINIHS in the last 720 hours.   Chemistry Recent Labs  Lab 02/24/24 2352 02/25/24 0451  NA 140 139  K 4.6 4.2  CL 105 105  CO2 26 24  GLUCOSE 122* 118*  BUN 13 13  CREATININE 0.98 0.89  CALCIUM 9.1 9.0  MG 2.1 2.2  GFRNONAA >60 >60  ANIONGAP 9 11    No results for input(s): PROT, ALBUMIN, AST, ALT, ALKPHOS, BILITOT in the last 168  hours. Lipids  Recent Labs  Lab 02/25/24 0451  CHOL 187  TRIG 155*  HDL 41  LDLCALC 115*  CHOLHDL 4.6    Hematology Recent Labs  Lab 02/24/24 2352 02/25/24 0451  WBC 6.7 6.6  RBC 4.46 4.52  HGB 12.8* 13.0  HCT 40.3 40.6  MCV 90.4 89.8  MCH 28.7 28.8  MCHC 31.8 32.0  RDW 14.3 14.3  PLT 263 265   Thyroid No results for input(s): TSH, FREET4 in the last 168 hours.  BNPNo results for input(s): BNP, PROBNP in the last 168 hours.  DDimer No results for input(s): DDIMER in the last 168 hours.  Radiology/Studies:  DG Chest Portable 1 View Result Date: 02/25/2024 EXAM: 1 VIEW(S) XRAY OF THE CHEST 02/25/2024 12:14:00 AM COMPARISON: AP and lateral chest 11/30/2019. CLINICAL HISTORY: FINDINGS: LINES, TUBES AND DEVICES: Multiple overlying monitor leads. LUNGS AND PLEURA: No focal pulmonary opacity. No pulmonary edema. No pleural effusion. No pneumothorax. HEART AND MEDIASTINUM: There is calcific plaque in the aortic arch. No acute abnormality of the cardiac and mediastinal silhouettes. BONES AND SOFT TISSUES: No acute osseous abnormality. IMPRESSION: 1. No acute cardiopulmonary process. 2. Aortic atherosclerosis. Electronically signed by: Francis Quam MD 02/25/2024 12:18 AM EST RP Workstation: [id number]     Assessment and Plan:  1. Chest Pain with Atypical Features - His episodes of chest pain have occurred twice and at night. No association with exertion and no improvement with nitroglycerin . Pain did resolve with Dilaudid while in the ED. - Troponin values have been negative and EKG does show nonspecific ST abnormalities. Will obtain a repeat EKG.  An echocardiogram is pending to assess for any structural abnormalities. - EDP reviewed with the overnight cardiology fellow and plans were to possibly obtain a coronary CTA but upon reviewing this with  the Coronary CT team at [facility], the scanner is currently down and it is unclear when this will be operational again. He also  consumed breakfast today which does not allow for us  to perform a [employer] today. - Given his atypical symptoms and negative troponin values, could likely plan for a Coronary CTA as an outpatient if echocardiogram is reassuring. If he continues to have episodes of pain today, could plan for a [employer].  2. Bradycardia - He has a long-standing history of bradycardia with prior monitor in 2021 showing average HR in the 40's. No evidence of high-grade AV block on telemetry. Continue to avoid AV nodal blocking agents.   3. Elevated BP without diagnosis of HTN - BP was initially elevated at 160/98 while in the ED and he did receive IV Hydralazine 5mg  overnight. SBP trending up to the 150's at the time of this encounter and will start Amlodipine 5mg  daily. Avoid AV nodal blocking agents due to bradycardia.   4. Lower Extremity Edema - He has 1+ pitting edema bilaterally to his mid shins. Suspect this is due to his high sodium intake as discussed above. The importance of reducing sodium intake was reviewed. Will dose IV Lasix 20 mg x 1 and follow response. Echocardiogram pending.  For questions or updates, please contact [facility] Please consult [email or web address] for contact info under    Signed, Laymon CHRISTELLA Qua, PA-C  02/25/2024 9:02 AM

## 2024-02-25 NOTE — Progress Notes (Signed)
  Echocardiogram 2D Echocardiogram has been performed.  Tinnie FORBES Gosling RDCS 02/25/2024, 2:48 PM

## 2024-02-26 DIAGNOSIS — I10 Essential (primary) hypertension: Secondary | ICD-10-CM | POA: Diagnosis not present

## 2024-02-26 DIAGNOSIS — R079 Chest pain, unspecified: Secondary | ICD-10-CM | POA: Diagnosis not present

## 2024-02-26 LAB — BASIC METABOLIC PANEL WITH GFR
Anion gap: 10 (ref 5–15)
Anion gap: 11 (ref 5–15)
BUN: 11 mg/dL (ref 6–20)
BUN: 11 mg/dL (ref 6–20)
CO2: 26 mmol/L (ref 22–32)
CO2: 26 mmol/L (ref 22–32)
Calcium: 9.3 mg/dL (ref 8.9–10.3)
Calcium: 9.5 mg/dL (ref 8.9–10.3)
Chloride: 102 mmol/L (ref 98–111)
Chloride: 102 mmol/L (ref 98–111)
Creatinine, Ser: 1.08 mg/dL (ref 0.61–1.24)
Creatinine, Ser: 1.02 mg/dL (ref 0.61–1.24)
GFR, Estimated: >60 mL/min (ref >60)
GFR, Estimated: >60 mL/min (ref >60)
Glucose, Bld: 86 mg/dL (ref 70–99)
Glucose, Bld: 138 mg/dL — ABNORMAL HIGH (ref 70–99)
Potassium: 4.5 mmol/L (ref 3.5–5.1)
Potassium: 4.1 mmol/L (ref 3.5–5.1)
Sodium: 138 mmol/L (ref 135–145)
Sodium: 139 mmol/L (ref 135–145)

## 2024-02-26 LAB — MAGNESIUM: Magnesium: 2 mg/dL (ref 1.7–2.4)

## 2024-02-26 MED ORDER — AMLODIPINE BESYLATE 5 MG PO TABS: 10.0000 mg | ORAL_TABLET | Freq: Every day | ORAL | Status: DC

## 2024-02-26 MED ORDER — ROSUVASTATIN CALCIUM 20 MG PO TABS: 20.0000 mg | ORAL_TABLET | Freq: Every day | ORAL | Status: AC

## 2024-02-26 MED ORDER — AMLODIPINE BESYLATE 10 MG PO TABS: 10.0000 mg | ORAL_TABLET | Freq: Every day | ORAL | Status: AC

## 2024-02-26 NOTE — TOC CM/SW Note (Signed)
 Transition of Care [facility]) - Inpatient Brief Assessment   Patient Details  Name: Benjamin Navarro MRN: [id number] Date of Birth: 24-Mar-1966  Transition of Care [facility]) CM/SW Contact:    Lucie Lunger, LCSWA Phone Number: 02/26/2024, 9:13 AM   Clinical Narrative: Transition of Care Department [facility]) has reviewed patient and no TOC needs have been identified at this time. We will continue to monitor patient advancement through interdiciplinary progression rounds. If new patient transition needs arise, please place a TOC consult.  Transition of Care Asessment: Insurance and Status: Insurance coverage has been reviewed Patient has primary care physician: Yes Home environment has been reviewed: From jail Prior level of function:: Independent Prior/Current Home Services: No current home services Social Drivers of Health Review: SDOH reviewed no interventions necessary Readmission risk has been reviewed: Yes Transition of care needs: no transition of care needs at this time

## 2024-02-26 NOTE — Discharge Summary (Signed)
 Physician Discharge Summary   Patient: Benjamin Navarro MRN: [id number] DOB: Jun 22, 1965  Admit date:     02/24/2024  Discharge date: 02/26/24  Discharge Physician: Alm Breyona Swander   PCP: Alvan Dorn FALCON, MD   Recommendations at discharge:   Please follow up with primary care provider within 1-2 weeks  Please repeat BMP and CBC in one week     Hospital Course: 58 year old male with a history of depression, bradycardia, tobacco abuse presenting with chest pain.  The patient states that he developed chest pain while at rest on the evening of 02/23/2024.  He stated it lasted about 5 minutes.  Once again, he developed another episode of substernal chest pain on 02/24/2024 around 10:30 PM lasting about 5 minutes.  He was in bed at the time.  He stated that he has some shortness of breath.  He denied any fever, chills, coughing, hemoptysis.  Palpitation, dizziness, nausea, vomiting, diarrhea, abdominal pain.  He states that he was recently given some Benadryl for some rash on his back.  He is not on any other medications. He denies any previous history of coronary disease.  He previously smoked 1 pack/day x 18 years.  He states he quit over 10 years ago.  Patient was given sublingual nitroglycerin  by EMS with improvement of his chest pressure.  In the ED, the patient was afebrile and hemodynamically stable with oxygen saturation 96% room air.  WBC 6.7, hemoglobin 12.8, platelets 263.  Sodium 140, potassium 4.6, bicarbonate 26, serum creatinine 0.98.  Magnesium 2.1.  Lipase 17.  Troponin < 15 x 2.  Chest x-ray was negative for infiltrates or edema. EKG showed sinus bradycardia with nonspecific T wave changes.  Cardiology was consulted to assist with management.  Assessment and Plan:  Chest pain - Troponin negative x 2 - Echo EF 65-70%, no WMA, G1DD - Personally reviewed EKG--sinus rhythm, nonspecific T wave changes - likely related to uncontrolled HTN - no further chest pain during hospitalization  after starting anti-HTN meds - appreciate cardiology consult>>follow up in office 4-6 weeks   Essential hypertension - Was given IV hydralazine in the ED - Plan to start antihypertensive therapy -started amlodipine 10 mg daily   Obesity - BMI 30.10 - Lifestyle modification  Mixed Hyperlipidemia - LDL 115 - triglycerides 155 - start crestor 20 mg daily    Consultants: cardiology Procedures performed: none  Disposition: Home Diet recommendation:  Cardiac diet DISCHARGE MEDICATION: Allergies as of 02/26/2024       Reactions   Penicillin G Hives   .Did it involve swelling of the face/tongue/throat, SOB, or low BP? No Did it involve sudden or severe rash/hives, skin peeling, or any reaction on the inside of your mouth or nose? Yes Did you need to seek medical attention at a hospital or doctor's office? Yes When did it last happen?      2019 If all above answers are NO, may proceed with cephalosporin use.   Iodinated Contrast Media Rash        Medication List     STOP taking these medications    ibuprofen  600 MG tablet Commonly known as: ADVIL        TAKE these medications    amLODipine 10 MG tablet Commonly known as: NORVASC Take 1 tablet (10 mg total) by mouth daily. Start taking on: February 27, 2024   rosuvastatin 20 MG tablet Commonly known as: CRESTOR Take 1 tablet (20 mg total) by mouth daily. Start taking on: February 27, 2024  Follow-up Information     Johnson Laymon HERO, PA-C Follow up on 03/25/2024.   Specialties: Cardiology, Cardiology Why: Cardiology Follow-up on 03/25/2024 at 3:30 PM. Contact information: [address] [place] [address] [phone]                Discharge Exam: Fredricka Weights   02/24/24 2334 02/26/24 0335  Weight: 89.8 kg 92.5 kg   HEENT:  [place]/AT, No thrush, no icterus CV:  RRR, no rub, no S3, no S4 Lung:  CTA, no wheeze, no rhonchi Abd:  soft/+BS, NT Ext:  No edema, no lymphangitis, no  synovitis, no rash   Condition at discharge: stable  The results of significant diagnostics from this hospitalization (including imaging, microbiology, ancillary and laboratory) are listed below for reference.   Imaging Studies: ECHOCARDIOGRAM COMPLETE Result Date: 02/25/2024    ECHOCARDIOGRAM REPORT   Patient Name:   Benjamin Navarro Date of Exam: 02/25/2024 Medical Rec #:  [id number]      Height:       68.0 in Accession #:    [id number]     Weight:       198.0 lb Date of Birth:  1965/07/07      BSA:          2.035 m Patient Age:    58 years       BP:           143/88 mmHg Patient Gender: M              HR:           51 bpm. Exam Location:  Inpatient Procedure: 2D Echo, Cardiac Doppler and Color Doppler (Both Spectral and Color            Flow Doppler were utilized during procedure). Indications:    Chest Pain R07.9  History:        Patient has prior history of Echocardiogram examinations, most                 recent 11/16/2019.  Sonographer:    Tinnie Gosling RDCS Referring Phys: [id number] CAROLE N HALL IMPRESSIONS  1. Left ventricular ejection fraction, by estimation, is 65 to 70%. The left ventricle has normal function. The left ventricle has no regional wall motion abnormalities. Left ventricular diastolic parameters are consistent with Grade I diastolic dysfunction (impaired relaxation).  2. Right ventricular systolic function is mildly reduced. The right ventricular size is normal. Tricuspid regurgitation signal is inadequate for assessing PA pressure.  3. The mitral valve is grossly normal. No evidence of mitral valve regurgitation.  4. The aortic valve is tricuspid. Aortic valve regurgitation is not visualized.  5. The inferior vena cava is normal in size with greater than 50% respiratory variability, suggesting right atrial pressure of 3 mmHg. Comparison(s): Prior images reviewed side by side. LVEF 65-70%, grade 1 diastolic dysfunction. Mildly reduced RV contraction. FINDINGS  Left Ventricle: Left  ventricular ejection fraction, by estimation, is 65 to 70%. The left ventricle has normal function. The left ventricle has no regional wall motion abnormalities. The left ventricular internal cavity size was normal in size. There is  no left ventricular hypertrophy. Left ventricular diastolic parameters are consistent with Grade I diastolic dysfunction (impaired relaxation). Right Ventricle: The right ventricular size is normal. No increase in right ventricular wall thickness. Right ventricular systolic function is mildly reduced. Tricuspid regurgitation signal is inadequate for assessing PA pressure. Left Atrium: Left atrial size was normal in size. Right Atrium: Right  atrial size was normal in size. Pericardium: There is no evidence of pericardial effusion. Mitral Valve: The mitral valve is grossly normal. No evidence of mitral valve regurgitation. Tricuspid Valve: The tricuspid valve is grossly normal. Tricuspid valve regurgitation is trivial. Aortic Valve: The aortic valve is tricuspid. There is mild aortic valve annular calcification. Aortic valve regurgitation is not visualized. Pulmonic Valve: The pulmonic valve was not well visualized. Pulmonic valve regurgitation is trivial. Aorta: The aortic root and ascending aorta are structurally normal, with no evidence of dilitation. Venous: The inferior vena cava is normal in size with greater than 50% respiratory variability, suggesting right atrial pressure of 3 mmHg. IAS/Shunts: No atrial level shunt detected by color flow Doppler. Additional Comments: 3D was performed not requiring image post processing on an independent workstation and was indeterminate.  LEFT VENTRICLE PLAX 2D LVIDd:         5.00 cm   Diastology LVIDs:         2.50 cm   LV e' medial:    7.51 cm/s LV PW:         0.90 cm   LV E/e' medial:  8.1 LV IVS:        0.90 cm   LV e' lateral:   6.96 cm/s LVOT diam:     1.80 cm   LV E/e' lateral: 8.7 LV SV:         56 LV SV Index:   28 LVOT Area:     2.54  cm  RIGHT VENTRICLE         IVC TAPSE (M-mode): 1.9 cm  IVC diam: 2.20 cm LEFT ATRIUM             Index        RIGHT ATRIUM           Index LA diam:        3.60 cm 1.77 cm/m   RA Area:     13.90 cm LA Vol (A2C):   30.6 ml 15.04 ml/m  RA Volume:   31.80 ml  15.63 ml/m LA Vol (A4C):   41.4 ml 20.34 ml/m LA Biplane Vol: 38.4 ml 18.87 ml/m  AORTIC VALVE LVOT Vmax:   117.00 cm/s LVOT Vmean:  65.800 cm/s LVOT VTI:    0.220 m  AORTA Ao Root diam: 2.80 cm Ao Asc diam:  2.80 cm MITRAL VALVE MV Area (PHT): 3.27 cm    SHUNTS MV Decel Time: 232 msec    Systemic VTI:  0.22 m MV E velocity: 60.80 cm/s  Systemic Diam: 1.80 cm MV A velocity: 67.60 cm/s MV E/A ratio:  0.90 Jayson Sierras MD Electronically signed by Jayson Sierras MD Signature Date/Time: 02/25/2024/3:36:46 PM    Final    DG Chest Portable 1 View Result Date: 02/25/2024 EXAM: 1 VIEW(S) XRAY OF THE CHEST 02/25/2024 12:14:00 AM COMPARISON: AP and lateral chest 11/30/2019. CLINICAL HISTORY: FINDINGS: LINES, TUBES AND DEVICES: Multiple overlying monitor leads. LUNGS AND PLEURA: No focal pulmonary opacity. No pulmonary edema. No pleural effusion. No pneumothorax. HEART AND MEDIASTINUM: There is calcific plaque in the aortic arch. No acute abnormality of the cardiac and mediastinal silhouettes. BONES AND SOFT TISSUES: No acute osseous abnormality. IMPRESSION: 1. No acute cardiopulmonary process. 2. Aortic atherosclerosis. Electronically signed by: Francis Quam MD 02/25/2024 12:18 AM EST RP Workstation: [id number]    Microbiology: Results for orders placed or performed during the hospital encounter of 02/24/24  MRSA Next Gen by PCR, Nasal     Status: None  Collection Time: 02/25/24 12:00 PM   Specimen: Nasal Mucosa; Nasal Swab  Result Value Ref Range Status   MRSA by PCR Next Gen NOT DETECTED NOT DETECTED Final    Comment: (NOTE) The GeneXpert MRSA Assay (FDA approved for NASAL specimens only), is one component of a comprehensive MRSA colonization  surveillance program. It is not intended to diagnose MRSA infection nor to guide or monitor treatment for MRSA infections. Test performance is not FDA approved in patients less than 48 years old. Performed at [facility], [address], [place], [place] [address]     Labs: CBC: Recent Labs  Lab 02/24/24 2352 02/25/24 0451  WBC 6.7 6.6  NEUTROABS 2.9  --   HGB 12.8* 13.0  HCT 40.3 40.6  MCV 90.4 89.8  PLT 263 265   Basic Metabolic Panel: Recent Labs  Lab 02/24/24 2352 02/25/24 0451 02/26/24 0631  NA 140 139 138  K 4.6 4.2 4.5  CL 105 105 102  CO2 26 24 26   GLUCOSE 122* 118* 86  BUN 13 13 11   CREATININE 0.98 0.89 1.08  CALCIUM 9.1 9.0 9.3  MG 2.1 2.2  --   PHOS  --  3.8  --    Liver Function Tests: No results for input(s): AST, ALT, ALKPHOS, BILITOT, PROT, ALBUMIN in the last 168 hours. CBG: Recent Labs  Lab 02/24/24 2343  GLUCAP 144*    Discharge time spent: greater than 30 minutes.  Signed: Alm Schneider, MD [facility] 02/26/2024

## 2024-03-24 NOTE — Progress Notes (Deleted)
 Cardiology Office Note    Date:  03/24/2024  ID:  [name], DOB [birth year], MRN [id number] Cardiologist: Alvan Carrier, MD { :  History of Present Illness:    Benjamin Navarro is a 58 y.o. male with past medical history of bradycardia, depression and tobacco use who presents to the office today for hospital follow-up.   He was recently admitted to [facility] in 02/2024 for evaluation of 2 episodes of chest pain which had occurred over 2 days prior to arrival.  Reported pain would occur at rest and last for 15 to 20 minutes and then spontaneously resolve.  Troponin values were negative and EKG showed slight T wave inversion along leads III, aVF and V3 through V6.  He received nitroglycerin  with no improvement in pain but pain did resolve with Dilaudid .  He was recommended to obtain an echocardiogram if this is reassuring, could obtain a coronary CTA as an outpatient.  His echocardiogram showed a preserved EF of 65 to 70% with no regional wall motion abnormalities.  He did have grade 1 diastolic dysfunction, mildly reduced RV function and no significant valve abnormalities.  Blood pressure had been elevated and he was started on Amlodipine  10 mg daily during admission along with Crestor  20 mg daily.  - FLP, LFT's   ROS: ***  Studies Reviewed:   EKG: EKG is*** ordered today and demonstrates ***   EKG Interpretation Date/Time:    Ventricular Rate:    PR Interval:    QRS Duration:    QT Interval:    QTC Calculation:   R Axis:      Text Interpretation:         Echocardiogram: 02/2024 IMPRESSIONS     1. Left ventricular ejection fraction, by estimation, is 65 to 70%. The  left ventricle has normal function. The left ventricle has no regional  wall motion abnormalities. Left ventricular diastolic parameters are  consistent with Grade I diastolic  dysfunction (impaired relaxation).   2. Right ventricular systolic function is mildly reduced. The right  ventricular size is  normal. Tricuspid regurgitation signal is inadequate  for assessing PA pressure.   3. The mitral valve is grossly normal. No evidence of mitral valve  regurgitation.   4. The aortic valve is tricuspid. Aortic valve regurgitation is not  visualized.   5. The inferior vena cava is normal in size with greater than 50%  respiratory variability, suggesting right atrial pressure of 3 mmHg.   Comparison(s): Prior images reviewed side by side. LVEF 65-70%, grade 1  diastolic dysfunction. Mildly reduced RV contraction.    Risk Assessment/Calculations:   {Does this patient have ATRIAL FIBRILLATION?:[phone]} No BP recorded.  {Refresh Note OR Click here to enter BP  :1}***         Physical Exam:   VS:  There were no vitals taken for this visit.   Wt Readings from Last 3 Encounters:  06/16/22 150 lb (68 kg)  11/16/19 134 lb 14.7 oz (61.2 kg)  01/21/19 134 lb 14.4 oz (61.2 kg)     GEN: Well nourished, well developed in no acute distress NECK: No JVD; No carotid bruits CARDIAC: ***RRR, no murmurs, rubs, gallops RESPIRATORY:  Clear to auscultation without rales, wheezing or rhonchi  ABDOMEN: Appears non-distended. No obvious abdominal masses. EXTREMITIES: No clubbing or cyanosis. No edema.  Distal pedal pulses are 2+ bilaterally.   Assessment and Plan:      {Are you ordering a CV Procedure (e.g. stress test, cath, DCCV,  TEE, etc)?   Press F2        :[id number]}   Signed, Laymon CHRISTELLA Qua, PA-C

## 2024-03-25 ENCOUNTER — Ambulatory Visit: Attending: Student | Admitting: Student

## 2024-04-16 ENCOUNTER — Emergency Department (HOSPITAL_COMMUNITY)
Admission: EM | Admit: 2024-04-16 | Discharge: 2024-04-16 | Attending: Emergency Medicine | Admitting: Emergency Medicine

## 2024-04-16 ENCOUNTER — Emergency Department (HOSPITAL_COMMUNITY)

## 2024-04-16 ENCOUNTER — Other Ambulatory Visit: Payer: Self-pay

## 2024-04-16 DIAGNOSIS — R202 Paresthesia of skin: Secondary | ICD-10-CM | POA: Diagnosis not present

## 2024-04-16 DIAGNOSIS — R079 Chest pain, unspecified: Secondary | ICD-10-CM

## 2024-04-16 DIAGNOSIS — R600 Localized edema: Secondary | ICD-10-CM | POA: Insufficient documentation

## 2024-04-16 DIAGNOSIS — R001 Bradycardia, unspecified: Secondary | ICD-10-CM | POA: Insufficient documentation

## 2024-04-16 DIAGNOSIS — R0789 Other chest pain: Secondary | ICD-10-CM | POA: Diagnosis present

## 2024-04-16 LAB — COMPREHENSIVE METABOLIC PANEL WITH GFR
ALT: 26 U/L (ref 0–44)
AST: 23 U/L (ref 15–41)
Albumin: 3.9 g/dL (ref 3.5–5.0)
Alkaline Phosphatase: 77 U/L (ref 38–126)
Anion gap: 11 (ref 5–15)
BUN: 12 mg/dL (ref 6–20)
CO2: 22 mmol/L (ref 22–32)
Calcium: 8.8 mg/dL — ABNORMAL LOW (ref 8.9–10.3)
Chloride: 106 mmol/L (ref 98–111)
Creatinine, Ser: 1.09 mg/dL (ref 0.61–1.24)
GFR, Estimated: >60 mL/min
Glucose, Bld: 87 mg/dL (ref 70–99)
Potassium: 4.1 mmol/L (ref 3.5–5.1)
Sodium: 140 mmol/L (ref 135–145)
Total Bilirubin: 0.3 mg/dL (ref 0.0–1.2)
Total Protein: 7.1 g/dL (ref 6.5–8.1)

## 2024-04-16 LAB — CBC WITH DIFFERENTIAL/PLATELET
Abs Immature Granulocytes: 0.02 K/uL (ref 0.00–0.07)
Basophils Absolute: 0.1 K/uL (ref 0.0–0.1)
Basophils Relative: 1 %
Eosinophils Absolute: 0.4 K/uL (ref 0.0–0.5)
Eosinophils Relative: 7 %
HCT: 41.6 % (ref 39.0–52.0)
Hemoglobin: 13.1 g/dL (ref 13.0–17.0)
Immature Granulocytes: 0 %
Lymphocytes Relative: 33 %
Lymphs Abs: 2 K/uL (ref 0.7–4.0)
MCH: 28 pg (ref 26.0–34.0)
MCHC: 31.5 g/dL (ref 30.0–36.0)
MCV: 88.9 fL (ref 80.0–100.0)
Monocytes Absolute: 0.6 K/uL (ref 0.1–1.0)
Monocytes Relative: 11 %
Neutro Abs: 3 K/uL (ref 1.7–7.7)
Neutrophils Relative %: 48 %
Platelets: 277 K/uL (ref 150–400)
RBC: 4.68 MIL/uL (ref 4.22–5.81)
RDW: 13.8 % (ref 11.5–15.5)
WBC: 6.1 K/uL (ref 4.0–10.5)
nRBC: 0 % (ref 0.0–0.2)

## 2024-04-16 LAB — CBG MONITORING, ED: Glucose-Capillary: 85 mg/dL (ref 70–99)

## 2024-04-16 LAB — TROPONIN T, HIGH SENSITIVITY
Troponin T High Sensitivity: <15 ng/L (ref 0–19)
Troponin T High Sensitivity: <15 ng/L (ref 0–19)

## 2024-04-16 LAB — PRO BRAIN NATRIURETIC PEPTIDE: Pro Brain Natriuretic Peptide: 105 pg/mL

## 2024-04-16 LAB — MAGNESIUM: Magnesium: 2.1 mg/dL (ref 1.7–2.4)

## 2024-04-16 MED ORDER — MORPHINE SULFATE (PF) 4 MG/ML IV SOLN
4.0000 mg | Freq: Once | INTRAVENOUS | Status: AC
  Administered 2024-04-16: 4 mg via INTRAVENOUS
  Filled 2024-04-16: qty 1

## 2024-04-16 MED ORDER — HYDROMORPHONE HCL 1 MG/ML IJ SOLN
0.5000 mg | Freq: Once | INTRAMUSCULAR | Status: AC
  Administered 2024-04-16: 0.5 mg via INTRAVENOUS
  Filled 2024-04-16: qty 0.5

## 2024-04-16 MED ORDER — ONDANSETRON HCL 4 MG/2ML IJ SOLN
4.0000 mg | Freq: Once | INTRAMUSCULAR | Status: AC
  Administered 2024-04-16: 4 mg via INTRAVENOUS
  Filled 2024-04-16: qty 2

## 2024-04-16 NOTE — ED Provider Notes (Signed)
 " [place] EMERGENCY DEPARTMENT AT [facility] Provider Note   CSN: [id number] Arrival date & time: 04/16/24  1149     Patient presents with: Chest Pain   Benjamin Navarro is a 59 y.o. male.    Chest Pain Associated symptoms: nausea        Benjamin Navarro is a 59 y.o. 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  Past medical history of chest pain, bradycardia, who presents to the Emergency Department brought in by EMS for evaluation of chest pain.  States that he woke around 430 this morning with pressure-like sensation to his left and mid chest.  He was given 2 nitroglycerin  and four 81 mg aspirin  without relief.  He states the pain feels like elephant sitting on my chest.  He is having some tingling sensation to the fingertips of his left hand.  He had some mild nausea at onset of his pain.  No further nausea or vomiting.  No neck or jaw pain.  Current pain feels similar to previous.  He states that he has had this pain intermittently for 4 to 5 years without a known cause of his chest pain.  He was here in November with similar symptoms and admitted.  Underwent echocardiogram that showed LVEF of 65 to 70%  Prior to Admission medications  Medication Sig Start Date End Date Taking? Authorizing Provider  amLODipine  (NORVASC ) 10 MG tablet Take 1 tablet (10 mg total) by mouth daily. 02/27/24   Evonnie Lenis, MD  rosuvastatin  (CRESTOR ) 20 MG tablet Take 1 tablet (20 mg total) by mouth daily. 02/27/24   Evonnie Lenis, MD    Allergies: Penicillin g and Iodinated contrast media    Review of Systems  Cardiovascular:  Positive for chest pain and leg swelling.  Gastrointestinal:  Positive for nausea.  All other systems reviewed and are negative.   Updated Vital Signs Pulse (!) 50   Temp 98.3 F (36.8 C) (Oral)   Resp (!) 24   Ht 5' 8 (1.727 m)   Wt 92.5 kg   SpO2 97%   BMI 31.01 kg/m   Physical Exam Vitals reviewed.  Constitutional:       General: He is not in acute distress.    Appearance: He is well-developed. He is not toxic-appearing.  HENT:     Mouth/Throat:     Mouth: Mucous membranes are moist.  Eyes:     Conjunctiva/sclera: Conjunctivae normal.  Cardiovascular:     Rate and Rhythm: Regular rhythm. Bradycardia present.     Pulses: Normal pulses.  Pulmonary:     Effort: Pulmonary effort is normal.     Breath sounds: Normal breath sounds.  Abdominal:     General: There is no distension.     Palpations: Abdomen is soft.     Tenderness: There is no abdominal tenderness.  Musculoskeletal:     Cervical back: Normal range of motion. No tenderness.     Right lower leg: Edema present.     Left lower leg: Edema present.     Comments: 1+ pitting edema of BLE  Skin:    General: Skin is warm.     Capillary Refill: Capillary refill takes less than 2 seconds.  Neurological:     General: No focal deficit present.     Mental Status: He is alert.     Sensory: No sensory deficit.     Motor: No weakness.     (all  labs ordered are listed, but only abnormal results are displayed) Labs Reviewed  COMPREHENSIVE METABOLIC PANEL WITH GFR - Abnormal; Notable for the following components:      Result Value   Calcium  8.8 (*)    All other components within normal limits  CBC WITH DIFFERENTIAL/PLATELET  MAGNESIUM  PRO BRAIN NATRIURETIC PEPTIDE  CBG MONITORING, ED  TROPONIN T, HIGH SENSITIVITY  TROPONIN T, HIGH SENSITIVITY    EKG: EKG Interpretation Date/Time:  Friday April 16 2024 11:59:58 EST Ventricular Rate:  52 PR Interval:  151 QRS Duration:  137 QT Interval:  415 QTC Calculation: 386 R Axis:   57  Text Interpretation: Sinus rhythm Nonspecific intraventricular conduction delay Borderline repolarization abnormality unchanged from NOvember EKG's Confirmed by Cleotilde Rogue ([id number]) on 04/16/2024 12:02:55 PM  Radiology: DG Chest Portable 1 View Result Date: 04/16/2024 CLINICAL DATA:  Chest pain. EXAM: PORTABLE CHEST  1 VIEW COMPARISON:  03/26/2024. FINDINGS: The heart size and mediastinal contours are unchanged. Aortic atherosclerosis. No overt pulmonary edema. No focal consolidation, sizeable pleural effusion, or pneumothorax. Mild bibasilar atelectasis. No acute osseous abnormality. IMPRESSION: 1. Mild bibasilar atelectasis. Otherwise, no acute cardiopulmonary findings. 2. Aortic atherosclerosis. Electronically Signed   By: Harrietta Sherry M.D.   On: 04/16/2024 13:19     Procedures   Medications Ordered in the ED - No data to display                                  Medical Decision Making   Patient brought in accompanied by officer from prison for evaluation of chest pain.  Woke with chest pain was given nitro and aspirin  without relief.  Describes pressure-like sensation central chest.  States that pain is similar to previous episodes of chest pain that he has been experiencing for several years without a known cause.  On review of medical record, patient here in November with similar symptoms and was admitted.  He had a echocardiogram at that time with a reassuring EF  Amount and/or Complexity of Data Reviewed Labs: ordered.    Details: Labs unremarkable.  Delta troponin unremarkable Radiology: ordered.    Details: Chest x-ray shows some mild bibasilar atelectasis otherwise no acute findings ECG/medicine tests: ordered.    Details: EKG shows sinus rhythm with nonspecific intraventricular conduction delay and borderline repolarization unchanged from previous Discussion of management or test interpretation with external provider(s):   On recheck, pain improving.  His workup is overall reassuring.  Chest pain recurrent.  Improved after IV fluids and pain medication.  Vital signs are reassuring.  Recent reassuring echocardiogram.  Cause of his recurrent chest pain unclear.  No indication of PE. he may need further evaluation with coronary CT .  Feel that he is appropriate for discharge back to the  facility.  Will provide ambulatory referral for cardiology. He has tolerated po fluids.  He was given return precautions as well.  Risk Prescription drug management.        Final diagnoses:  Nonspecific chest pain    ED Discharge Orders     None          Herlinda Milling, PA-C 04/18/24 1157  "

## 2024-04-16 NOTE — ED Notes (Signed)
 Called and spoke with RN at [facility] gave report on pt and verbalized they're okay with them coming back.

## 2024-04-16 NOTE — Discharge Instructions (Addendum)
 You have been provided referral to cardiology.  Someone from the clinic will likely contact you or facility to arrange follow-up appointment.  Return to ER for any new or worsening symptoms

## 2024-04-16 NOTE — ED Triage Notes (Addendum)
 Pt arrived via [facility]. Pt came from [facility]. Pt states that he has been having a feeling of an elephant sitting on his chest since last night around 0430. Pt provided 2 Nitros  And 4 81 mg aspirin  by EMS with no relief. Pain is located central to left side of his chest.
